# Patient Record
Sex: Female | Born: 1950 | Race: White | Hispanic: No | Smoking: Never smoker
Health system: Southern US, Community
[De-identification: ages and names within clinical notes are randomized; demographics above are authoritative.]

## PROBLEM LIST (undated history)

## (undated) DIAGNOSIS — C719 Malignant neoplasm of brain, unspecified: Secondary | ICD-10-CM

---

## 2021-08-30 DIAGNOSIS — T8132XA Disruption of internal operation (surgical) wound, not elsewhere classified, initial encounter: Secondary | ICD-10-CM

## 2021-08-30 DIAGNOSIS — T81328A Disruption or dehiscence of closure of other specified internal operation (surgical) wound, initial encounter: Secondary | ICD-10-CM

## 2021-08-30 HISTORY — DX: Disruption of internal operation (surgical) wound, not elsewhere classified, initial encounter: T81.32XA

## 2021-08-30 HISTORY — DX: Disruption or dehiscence of closure of other specified internal operation (surgical) wound, initial encounter: T81.328A

## 2021-09-23 ENCOUNTER — Other Ambulatory Visit: Payer: Self-pay | Admitting: Surgery

## 2021-09-23 ENCOUNTER — Other Ambulatory Visit (HOSPITAL_COMMUNITY): Payer: Self-pay | Admitting: Surgery

## 2021-09-23 DIAGNOSIS — C719 Malignant neoplasm of brain, unspecified: Secondary | ICD-10-CM

## 2021-09-25 ENCOUNTER — Other Ambulatory Visit: Payer: Self-pay

## 2021-09-25 ENCOUNTER — Ambulatory Visit
Admission: RE | Admit: 2021-09-25 | Discharge: 2021-09-25 | Disposition: A | Discharge status: Home / Self Care | Payer: Medicare Other | Source: Ambulatory Visit | Attending: Surgery | Admitting: Surgery

## 2021-09-25 DIAGNOSIS — C719 Malignant neoplasm of brain, unspecified: Secondary | ICD-10-CM | POA: Diagnosis present

## 2021-09-25 IMAGING — MR MR HEAD WO/W CM
16 series · 48 of 48 positions shown · IV contrast (gadavist)
Comparison: None.

CLINICAL DATA: History of glioblastoma with craniotomy [DATE]

EXAM:
MRI HEAD WITHOUT AND WITH CONTRAST
TECHNIQUE: Multiplanar, multiecho pulse sequences of the brain and surrounding
structures were obtained without and with intravenous contrast.
CONTRAST:  6mL GADAVIST GADOBUTROL 1 MMOL/ML IV SOLN

[Series 5: ax dwi_tracew · axial · 3.0mm · 0.65mm/px · z∈[-95,+56]mm · 3 of 48 slices shown]
[im 1/48]
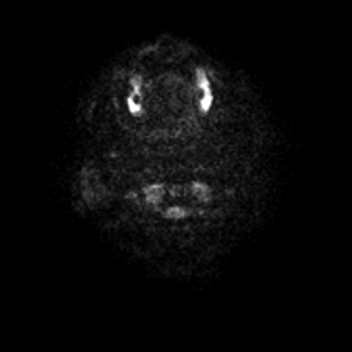
[im 24/48]
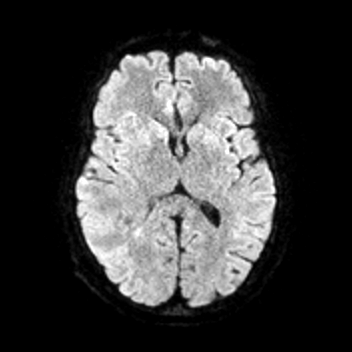
[im 48/48]
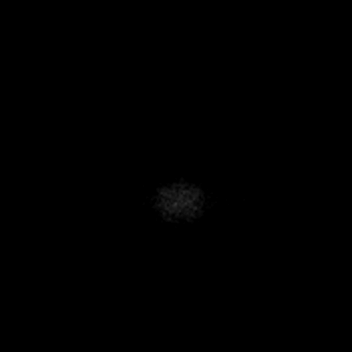

[Series 6: ax dwi_adc · axial · 3.0mm · 0.65mm/px · z∈[-95,+49]mm · 3 of 46 slices shown]
[im 1/46]
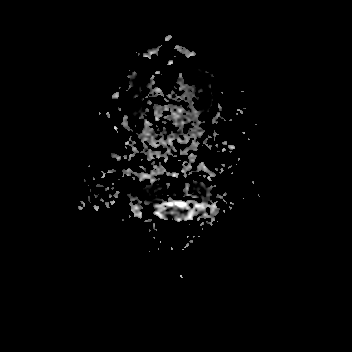
[im 23/46]
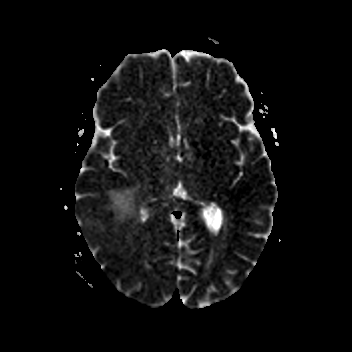
[im 46/46]
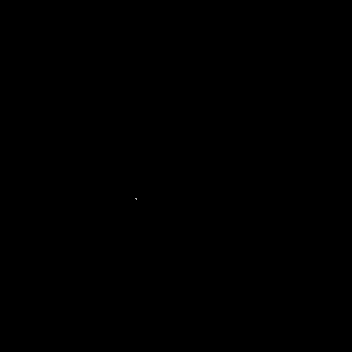

[Series 7: cor dwi_tracew · coronal · 5.0mm · 0.65mm/px · 2 of 40 slices shown]
[im 1/40]
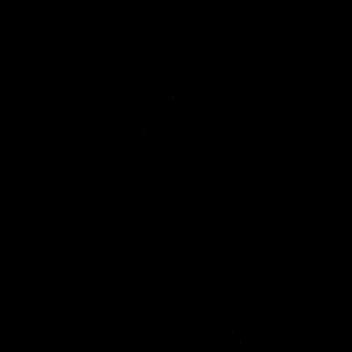
[im 40/40]
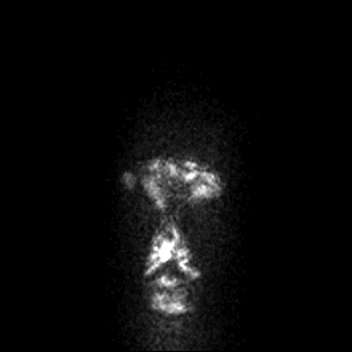

[Series 8: cor dwi_adc · coronal · 5.0mm · 0.65mm/px · 2 of 35 slices shown]
[im 1/35]
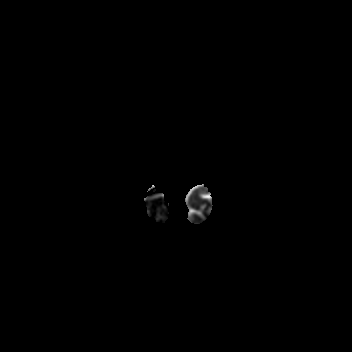
[im 35/35]
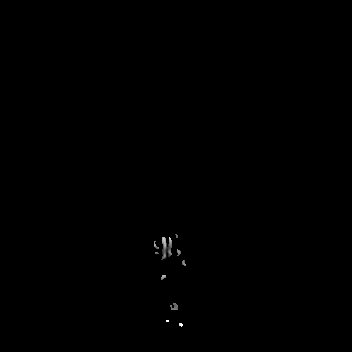

[Series 9: T1 · sagittal · 5.0mm · 0.62mm/px · 1 of 25 slices shown (1 of 2)]
[im 1/25]
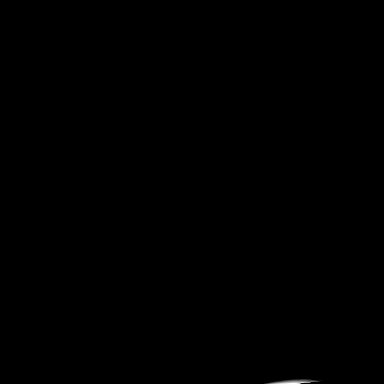

[Series 10: T2 · axial · 5.0mm · 0.53mm/px · 1 of 25 slices shown]
[im 1/25]
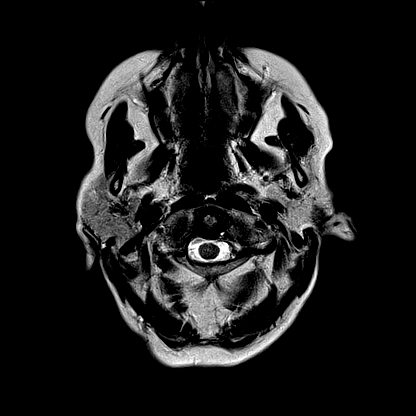

[Series 11: mag_images · axial · 3.0mm · 0.90mm/px · z∈[-105,+68]mm · 3 of 60 slices shown]
[im 1/60]
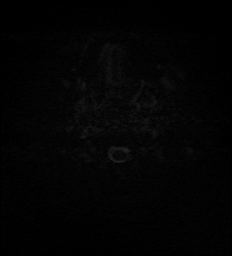
[im 30/60]
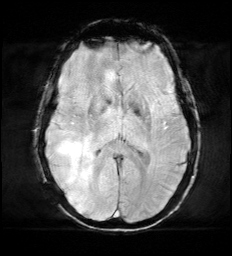
[im 60/60]
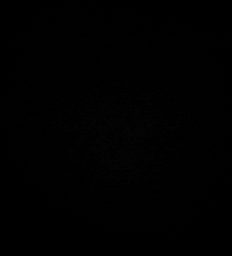

[Series 12: pha_images · axial · 3.0mm · 0.90mm/px · z∈[-102,+68]mm · 3 of 58 slices shown]
[im 1/58]
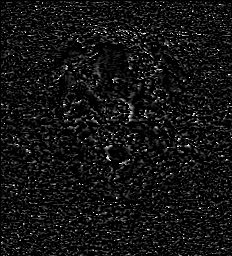
[im 29/58]
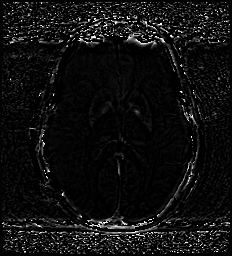
[im 58/58]
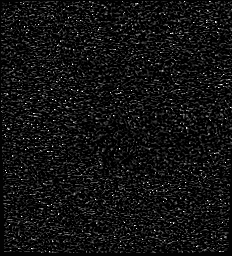

[Series 13: swi_images · axial · 3.0mm · 0.90mm/px · z∈[-105,+68]mm · 3 of 60 slices shown]
[im 1/60]
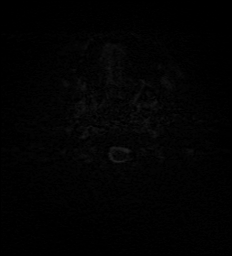
[im 30/60]
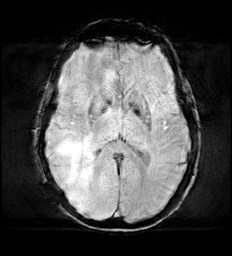
[im 60/60]
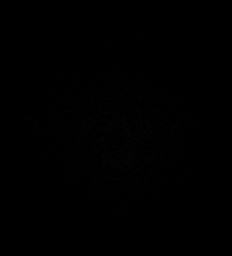

[Series 14: mip_images(sw) · axial · 24.0mm · 0.90mm/px · z∈[-95,+57]mm · 3 of 53 slices shown]
[im 1/53]
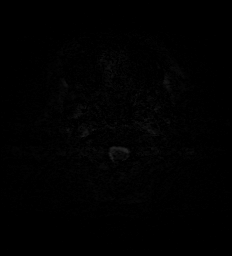
[im 27/53]
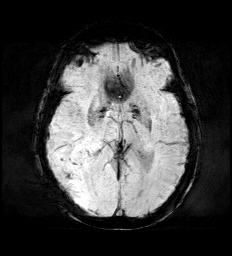
[im 53/53]
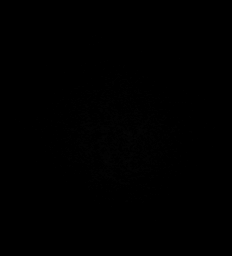

[Series 15: FLAIR · axial · 3.0mm · 0.53mm/px · z∈[-99,+59]mm · 3 of 55 slices shown]
[im 1/55]
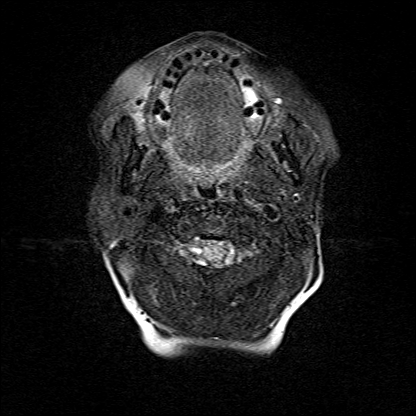
[im 28/55]
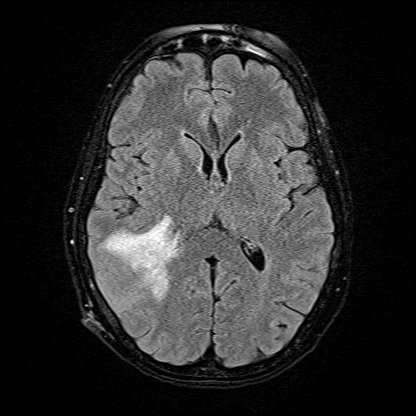
[im 55/55]
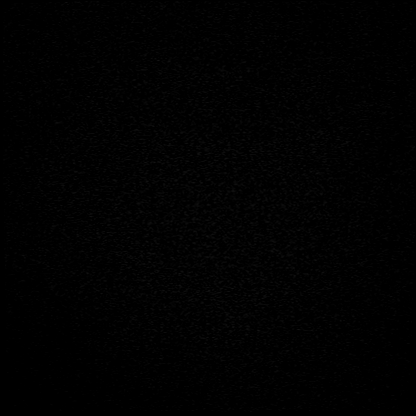

[Series 16: T1 · axial · 1.0mm · 0.98mm/px · z∈[-103,+68]mm · 9 of 176 slices shown (2 of 2)]
[im 1/176]
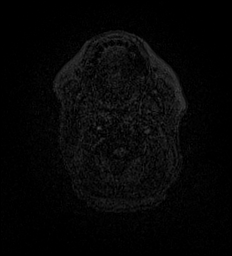
[im 22/176]
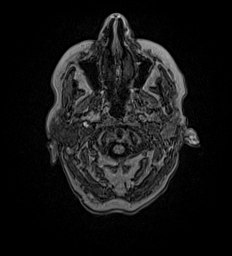
[im 44/176]
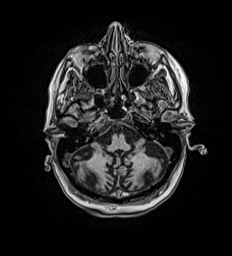
[im 66/176]
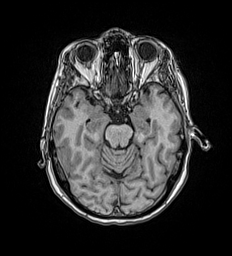
[im 88/176]
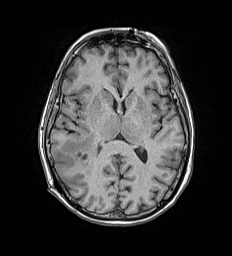
[im 110/176]
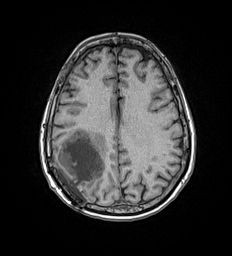
[im 132/176]
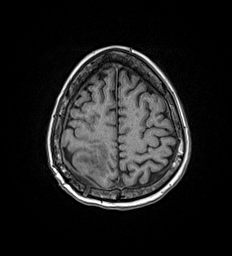
[im 154/176]
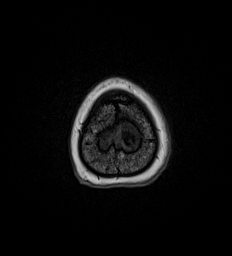
[im 176/176]
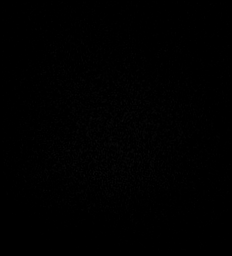

[Series 17: T2 post-contrast · coronal · 5.0mm · 0.57mm/px · 1 of 29 slices shown]
[im 1/29]
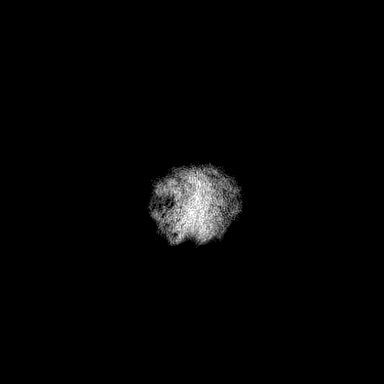

[Series 18: T1 post-contrast · axial · 1.0mm · 0.98mm/px · z∈[-103,+68]mm · 9 of 176 slices shown (1 of 3)]
[im 1/176]
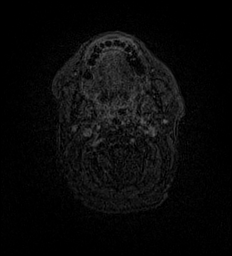
[im 22/176]
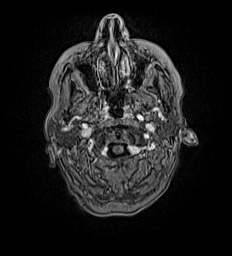
[im 44/176]
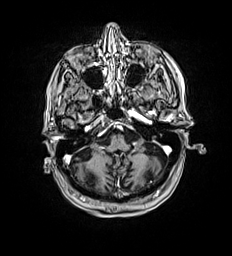
[im 66/176]
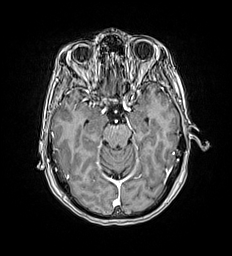
[im 88/176]
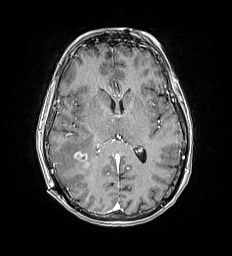
[im 110/176]
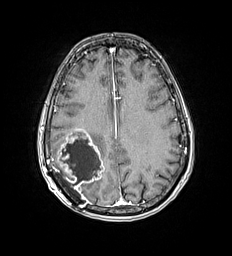
[im 132/176]
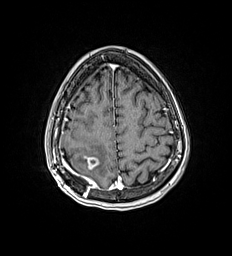
[im 154/176]
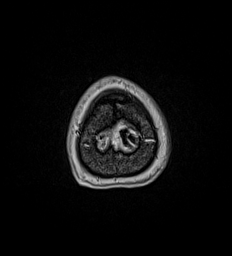
[im 176/176]
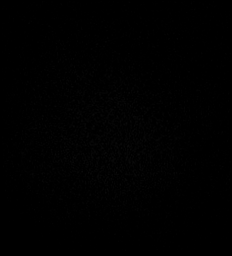

[Series 19: T1 post-contrast · coronal · 5.0mm · 0.57mm/px · 1 of 29 slices shown (2 of 3)]
[im 1/29]
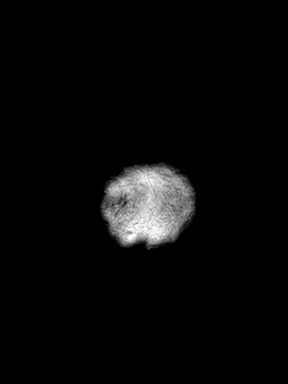

[Series 20: T1 post-contrast · sagittal · 5.0mm · 0.62mm/px · 1 of 24 slices shown (3 of 3)]
[im 1/24]
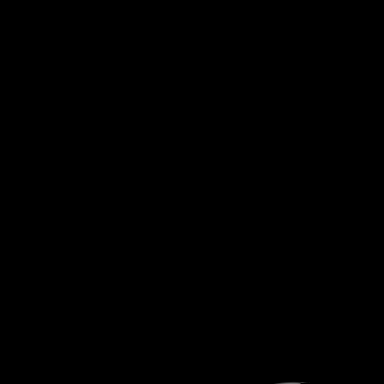

[48 of 48 positions shown; findings below may reference images not displayed]

FINDINGS: Brain: Resection cavity in the right parietal region with variable
thickness peripheral enhancement. Especially at the superior and
lateral margins of the mass there is nodular type enhancement with
signs of dense cellularity by diffusion and T2 weighted imaging.
Local mass effect without midline shift. There is epidural fluid
beneath the bone flap without significant mass effect. No
intraventricular or generalized subarachnoid spread.

No incidental infarct, acute hemorrhage, hydrocephalus, or second
lesion.

Marked cerebellar atrophy which is symmetric and without specific
signal abnormality.

Vascular: Normal flow voids and vascular enhancements.

Skull and upper cervical spine: Unremarkable right parietal
craniotomy

Sinuses/Orbits: Negative
IMPRESSION: 1. Resection cavity centered in the right parietal lobe the
correlates with history of glioblastoma and recent resection.
2. No available preoperative comparison imaging or report. There is
a tumoral appearance at the periphery of the cavity with
enhancement. Mass effect is mild for the size of lesion.
3. Notable cerebellar atrophy.

## 2021-09-25 MED ORDER — GADOBUTROL 1 MMOL/ML IV SOLN
6.0000 mL | Freq: Once | INTRAVENOUS | Status: AC | PRN
  Administered 2021-09-25: 6 mL via INTRAVENOUS

## 2021-10-01 DIAGNOSIS — C719 Malignant neoplasm of brain, unspecified: Secondary | ICD-10-CM | POA: Insufficient documentation

## 2021-10-03 DIAGNOSIS — G40909 Epilepsy, unspecified, not intractable, without status epilepticus: Secondary | ICD-10-CM | POA: Insufficient documentation

## 2021-10-21 ENCOUNTER — Encounter: Payer: Self-pay | Admitting: Neurology

## 2021-10-21 ENCOUNTER — Encounter: Payer: Self-pay | Admitting: *Deleted

## 2021-10-21 ENCOUNTER — Ambulatory Visit
Admission: RE | Admit: 2021-10-21 | Discharge: 2021-10-21 | Disposition: A | Discharge status: Home / Self Care | Payer: Self-pay | Source: Ambulatory Visit | Attending: Radiation Oncology | Admitting: Radiation Oncology

## 2021-10-21 ENCOUNTER — Other Ambulatory Visit: Payer: Self-pay | Admitting: *Deleted

## 2021-10-21 ENCOUNTER — Telehealth: Payer: Self-pay | Admitting: *Deleted

## 2021-10-21 ENCOUNTER — Encounter: Payer: Self-pay | Admitting: Physician Assistant

## 2021-10-21 ENCOUNTER — Encounter: Payer: Self-pay | Admitting: Neurosurgery

## 2021-10-21 DIAGNOSIS — G9389 Other specified disorders of brain: Secondary | ICD-10-CM

## 2021-10-21 NOTE — Telephone Encounter (Signed)
First craniotomy was perfomed at [facility] in [facility] by Dr. Hardin Negus.   We will request medical records and a disc of all images be sent.

## 2021-10-21 NOTE — Telephone Encounter (Signed)
Called patient to give appointment details for Dr. Mickeal Skinner and Dr. Baruch Gouty on Friday 10/25/21 at 9:00.  Patient did not answer, VM left with appointment details and instructions to call back to confirm and so we can ask where she had her first craniotomy performed.

## 2021-10-25 ENCOUNTER — Encounter: Payer: Self-pay | Admitting: Radiation Oncology

## 2021-10-25 ENCOUNTER — Encounter (INDEPENDENT_AMBULATORY_CARE_PROVIDER_SITE_OTHER): Payer: Self-pay

## 2021-10-25 ENCOUNTER — Other Ambulatory Visit: Payer: Self-pay

## 2021-10-25 ENCOUNTER — Inpatient Hospital Stay (HOSPITAL_BASED_OUTPATIENT_CLINIC_OR_DEPARTMENT_OTHER): Payer: Medicare Other | Admitting: Internal Medicine

## 2021-10-25 ENCOUNTER — Other Ambulatory Visit (HOSPITAL_COMMUNITY): Payer: Self-pay

## 2021-10-25 ENCOUNTER — Ambulatory Visit
Admission: RE | Admit: 2021-10-25 | Discharge: 2021-10-25 | Disposition: A | Discharge status: Home / Self Care | Payer: Medicare Other | Source: Ambulatory Visit | Attending: Radiation Oncology | Admitting: Radiation Oncology

## 2021-10-25 ENCOUNTER — Telehealth: Payer: Self-pay | Admitting: Pharmacy Technician

## 2021-10-25 ENCOUNTER — Telehealth: Payer: Self-pay | Admitting: Pharmacist

## 2021-10-25 ENCOUNTER — Encounter: Payer: Self-pay | Admitting: Internal Medicine

## 2021-10-25 VITALS — BP 147/75 | HR 98 | Temp 97.5°F | Resp 16 | Ht 62.0 in | Wt 145.1 lb

## 2021-10-25 DIAGNOSIS — C713 Malignant neoplasm of parietal lobe: Secondary | ICD-10-CM | POA: Insufficient documentation

## 2021-10-25 DIAGNOSIS — C719 Malignant neoplasm of brain, unspecified: Secondary | ICD-10-CM

## 2021-10-25 DIAGNOSIS — G40909 Epilepsy, unspecified, not intractable, without status epilepticus: Secondary | ICD-10-CM | POA: Diagnosis not present

## 2021-10-25 DIAGNOSIS — G4700 Insomnia, unspecified: Secondary | ICD-10-CM | POA: Insufficient documentation

## 2021-10-25 DIAGNOSIS — Z510 Encounter for antineoplastic radiation therapy: Secondary | ICD-10-CM | POA: Insufficient documentation

## 2021-10-25 DIAGNOSIS — R52 Pain, unspecified: Secondary | ICD-10-CM | POA: Diagnosis not present

## 2021-10-25 DIAGNOSIS — D496 Neoplasm of unspecified behavior of brain: Secondary | ICD-10-CM | POA: Insufficient documentation

## 2021-10-25 DIAGNOSIS — Z79899 Other long term (current) drug therapy: Secondary | ICD-10-CM | POA: Insufficient documentation

## 2021-10-25 DIAGNOSIS — Z7952 Long term (current) use of systemic steroids: Secondary | ICD-10-CM | POA: Insufficient documentation

## 2021-10-25 HISTORY — DX: Malignant neoplasm of brain, unspecified: C71.9

## 2021-10-25 MED ORDER — ONDANSETRON HCL 8 MG PO TABS
8.0000 mg | ORAL_TABLET | Freq: Two times a day (BID) | ORAL | 1 refills | Status: DC | PRN
  Filled 2021-10-25 – 2021-11-05 (×2): qty 30, 15d supply, fill #0

## 2021-10-25 MED ORDER — TEMOZOLOMIDE 100 MG PO CAPS
100.0000 mg | ORAL_CAPSULE | Freq: Every day | ORAL | 0 refills | Status: DC
  Filled 2021-10-25: qty 45, 45d supply, fill #0
  Filled 2021-10-29: qty 42, 42d supply, fill #0

## 2021-10-25 MED ORDER — TEMOZOLOMIDE 20 MG PO CAPS
20.0000 mg | ORAL_CAPSULE | Freq: Every day | ORAL | 0 refills | Status: DC
  Filled 2021-10-25 – 2021-10-29 (×2): qty 42, 42d supply, fill #0

## 2021-10-25 NOTE — Telephone Encounter (Signed)
Oral Oncology Pharmacist Encounter ? ?Received new prescription for Temodar (temozolomide) for the treatment of GBM in conjunction with radiation, planned duration until the end of radiation. ? ?CBC from 10/05/21 (Care Everywhere) assessed, no relevant lab abnormalities. Prescription dose and frequency assessed.  ? ?Current medication list in Epic reviewed, no DDIs with temozolomide identified. ? ?Evaluated chart and no patient barriers to medication adherence identified.  ? ?Prescription has been e-scribed to the [facility] for benefits analysis and approval. ? ?Oral Oncology Clinic will continue to follow for insurance authorization, copayment issues, initial counseling and start date. ? ? ?Darl Pikes, PharmD, BCPS, BCOP, CPP ?Hematology/Oncology Clinical Pharmacist Practitioner ?[place]/[facility]/AP Oral Chemotherapy Navigation Clinic ?[phone] ? ?10/25/2021 1:20 PM ? ?

## 2021-10-25 NOTE — Progress Notes (Signed)
[facility] at [facility], [place] [address] [phone]   New Patient Evaluation  Date of Service: 10/25/21 Patient Name: Nina Johnson Patient MRN: [id number] Patient DOB: 04-Sep-1950 Provider: Ventura Sellers, MD  Identifying Statement:  [name] is a 71 y.o. female with right parietal glioblastoma who presents for initial consultation and evaluation.    Referring Provider: No referring provider defined for this encounter.  Oncologic History: Oncology History  Glioblastoma ([place])  10/01/2021 Initial Diagnosis   Glioblastoma ([place])   10/04/2021 Surgery   Re-resection at [facility] with Dr. Tommi Rumps; path c/w glioblastoma IDHwt     Biomarkers:  MGMT Unknown.  IDH 1/2 Wild type.  EGFR Unknown  TERT Unknown   History of Present Illness: The patient's records from the referring physician were obtained and reviewed and the patient interviewed to confirm this HPI.  Nina Johnson presented to medical attention in [facility] in early January this year after first ever seizure.  CNS imaging demonstrated large R parietal mass; she underwent initial resection in [place], where path demonstrated glioblastoma.  She then went to [facility] for additional evaluation, where she subsequently underwent repeat craniotomy with Dr. Tommi Rumps given large amount of residual disease.  Since surgery, she has no specific complaints aside from fatigue.  She remains active, functionally independent, without recurrence of seizures. She would like to obtain radiation and chemotherapy treatments close to home, thus the referral here today.  Medications: No current outpatient medications on file prior to visit.   No current facility-administered medications on file prior to visit.    Allergies: Not on File Past Medical History:  Past Medical History:  Diagnosis Date   Glioblastoma [facility])    Past Surgical History:  Social History:  Social  History   Socioeconomic History   Marital status: Widowed    Spouse name: Not on file   Number of children: Not on file   Years of education: Not on file   Highest education level: Not on file  Occupational History   Not on file  Tobacco Use   Smoking status: Not on file   Smokeless tobacco: Not on file  Substance and Sexual Activity   Alcohol use: Not on file   Drug use: Not on file   Sexual activity: Not on file  Other Topics Concern   Not on file  Social History Narrative   Not on file   Social Determinants of Health   Financial Resource Strain: Not on file  Food Insecurity: Not on file  Transportation Needs: Not on file  Physical Activity: Not on file  Stress: Not on file  Social Connections: Not on file  Intimate Partner Violence: Not on file   Family History: No family history on file.  Review of Systems: Constitutional: Doesn't report fevers, chills or abnormal weight loss Eyes: Doesn't report blurriness of vision Ears, nose, mouth, throat, and face: Doesn't report sore throat Respiratory: Doesn't report cough, dyspnea or wheezes Cardiovascular: Doesn't report palpitation, chest discomfort  Gastrointestinal:  Doesn't report nausea, constipation, diarrhea GU: Doesn't report incontinence Skin: Doesn't report skin rashes Neurological: Per HPI Musculoskeletal: Doesn't report joint pain Behavioral/Psych: Doesn't report anxiety  Physical Exam: Wt Readings from Last 3 Encounters:  10/25/21 145 lb 1.6 oz (65.8 kg)   Temp Readings from Last 3 Encounters:  10/25/21 (!) 97.5 F (36.4 C) (Tympanic)   BP Readings from Last 3 Encounters:  10/25/21 (!) 147/75   Pulse  Readings from Last 3 Encounters:  10/25/21 98    KPS: 90. General: Alert, cooperative, pleasant, in no acute distress Head: Normal EENT: No conjunctival injection or scleral icterus.  Lungs: Resp effort normal Cardiac: Regular rate Abdomen: Non-distended abdomen Skin: No rashes cyanosis or  petechiae. Extremities: No clubbing or edema  Neurologic Exam: Mental Status: Awake, alert, attentive to examiner. Oriented to self and environment. Language is fluent with intact comprehension.  Cranial Nerves: Visual acuity is grossly normal. Visual fields are full. Extra-ocular movements intact. No ptosis. Face is symmetric Motor: Tone and bulk are normal. Power is full in both arms and legs. Reflexes are symmetric, no pathologic reflexes present.  Sensory: Intact to light touch Gait: Normal.   Labs: I have reviewed the data as listed No results found for: NA, K, CL, CO2, GLUCOSE, BUN, CREATININE, CALCIUM, PROT, ALBUMIN, AST, ALT, ALKPHOS, BILITOT, GFRNONAA, GFRAA No results found for: WBC, NEUTROABS, HGB, HCT, MCV, PLT  Imaging:  MR BRAIN W WO CONTRAST  Result Date: 09/26/2021 CLINICAL DATA:  History of glioblastoma with craniotomy 08/30/2021 EXAM: MRI HEAD WITHOUT AND WITH CONTRAST TECHNIQUE: Multiplanar, multiecho pulse sequences of the brain and surrounding structures were obtained without and with intravenous contrast. CONTRAST:  36mL GADAVIST GADOBUTROL 1 MMOL/ML IV SOLN COMPARISON:  None. FINDINGS: Brain: Resection cavity in the right parietal region with variable thickness peripheral enhancement. Especially at the superior and lateral margins of the mass there is nodular type enhancement with signs of dense cellularity by diffusion and T2 weighted imaging. Local mass effect without midline shift. There is epidural fluid beneath the bone flap without significant mass effect. No intraventricular or generalized subarachnoid spread. No incidental infarct, acute hemorrhage, hydrocephalus, or second lesion. Marked cerebellar atrophy which is symmetric and without specific signal abnormality. Vascular: Normal flow voids and vascular enhancements. Skull and upper cervical spine: Unremarkable right parietal craniotomy Sinuses/Orbits: Negative IMPRESSION: 1. Resection cavity centered in the right  parietal lobe the correlates with history of glioblastoma and recent resection. 2. No available preoperative comparison imaging or report. There is a tumoral appearance at the periphery of the cavity with enhancement. Mass effect is mild for the size of lesion. 3. Notable cerebellar atrophy. Electronically Signed   By: Jorje Guild M.D.   On: 09/26/2021 08:18    Pathology (10/04/21): A-B. Brain, right parietal lesion, resection:   Glioblastoma, IDH-wildtype, CNS WHO Grade 4, residual.    Comment: IDH status was determined by immunostaining for the IDH1 R132H mutation; in patients 59 and older, this immunophenotype almost always indicates IDH-wildtype status.  Assessment/Plan Glioblastoma ([place])  Penni Penado presents today with clinical and radiographic syndrome consistent with right parietal glioblastoma, IDHwt.  Dr. Hali Marry at [facility] will act as her primary neuro-oncologist, we will implement treatment plan here in [facility] per patient preference.    We had an extensive conversation with her regarding pathology, prognosis, and available treatment pathways.    We ultimately recommended proceeding with course of intensity modulated radiation therapy and concurrent daily Temozolomide.  Radiation will be administered Mon-Fri over 6 weeks, Temodar will be dosed at $Remove'75mg'zpPdvHV$ /m2 to be given daily over 42 days.  We reviewed side effects of temodar, including fatigue, nausea/vomiting, constipation, and cytopenias.  Informed consent was verbally obtained at bedside to proceed with oral chemotherapy.  Chemotherapy should be held for the following:  ANC less than 1,000  Platelets less than 100,000  LFT or creatinine greater than 2x ULN  If clinical concerns/contraindications develop  Every 2 weeks during radiation,  labs will be checked accompanied by a clinical evaluation in the brain tumor clinic.  She will con't Keppra $RemoveBefo'500mg'euaBagWyUMo$  BID, remain off dexamethasone.  We appreciate the opportunity to  participate in the care of Aloni Chuang.   Screening for potential clinical trials was performed and discussed using eligibility criteria for active protocols at [facility], loco-regional tertiary centers, as well as national database available on [email or web address].com.    The patient is not a candidate for a research protocol at this time due to no suitable study identified.   We spent twenty additional minutes teaching regarding the natural history, biology, and historical experience in the treatment of brain tumors. We then discussed in detail the current recommendations for therapy focusing on the mode of administration, mechanism of action, anticipated toxicities, and quality of life issues associated with this plan. We also provided teaching sheets for the patient to take home as an additional resource.  All questions were answered. The patient knows to call the clinic with any problems, questions or concerns. No barriers to learning were detected.  The total time spent in the encounter was 60 minutes and more than 50% was on counseling and review of test results   Ventura Sellers, MD Medical Director of Neuro-Oncology [facility] [place] 10/25/21 9:15 AM

## 2021-10-25 NOTE — Progress Notes (Signed)
START ON PATHWAY REGIMEN - Neuro ? ? ?  One cycle, concurrent with RT: ?    Temozolomide  ? ?**Always confirm dose/schedule in your pharmacy ordering system** ? ?Patient Characteristics: ?Glioma, Glioblastoma, IDH-wildtype, Newly Diagnosed / Treatment Naive, Good Performance Status and/or Younger Patient, MGMT Promoter Unmethylated/Unknown ?Disease Classification: Glioma ?Disease Classification: Glioblastoma, IDH-wildtype ?Disease Status: Newly Diagnosed / Treatment Naive ?Performance Status: Good Performance Status and/or Younger Patient ?MGMT Promoter Methylation Status: Awaiting Test Results ?Intent of Therapy: ?Non-Curative / Palliative Intent, Discussed with Patient ?

## 2021-10-25 NOTE — Consult Note (Signed)
?NEW PATIENT EVALUATION ? ?Name: Nina Johnson  ?MRN: [id number]  ?Date:   10/25/2021     ?DOB: April 26, 1951 ? ? ?This 71 y.o. female patient presents to the clinic for initial evaluation of right parietal GBM status post resection x2. ? ?REFERRING PHYSICIAN: No ref. provider found ? ?CHIEF COMPLAINT:  ?Chief Complaint  ?Patient presents with  ? Cancer  ?  Initial consultation  ? ? ?DIAGNOSIS: The encounter diagnosis was Glioblastoma ([place]). ?  ?PREVIOUS INVESTIGATIONS:  ?MRI scans reviewed ?Pathology report reviewed ?Clinical notes reviewed ? ?HPI: Patient is a 71 year old female who presented with first onset of seizure.  She was seen in [facility] MRI scan showed right parietal mass.  She underwent a partial resection for GBM.  She was also seen at [facility] who underwent reexcision.  MRI postop showed expected postsurgical changes with persistent linear contrast-enhancement at the periphery which may represent post evolving surgical changes or residual tumor.  Surgical pathology confirmed glioblastoma IDH wild-type CNS who grade 4.  She is tolerated both surgeries well.  She has very little neurologic compromise at this point.  She is having no headaches no change in visual fields or any focal neurologic deficits.  She is now referred to both radiation oncology and medical oncology for consideration of adjuvant treatment. ? ?PLANNED TREATMENT REGIMEN: IMRT partial brain radiation plus Temodar ? ?PAST MEDICAL HISTORY:  has a past medical history of Dehiscence of closure of skull or craniotomy (10/04/2021), Dehiscence of closure of skull or craniotomy (08/30/2021), and Glioblastoma ([place]).   ? ?PAST SURGICAL HISTORY ? ?FAMILY HISTORY: family history includes Cancer in an other family member. ? ?SOCIAL HISTORY:  reports that she has never smoked. She has never used smokeless tobacco. She reports that she does not drink alcohol and does not use drugs. ? ?ALLERGIES: Patient has no known allergies. ? ?MEDICATIONS:  ?Current  Outpatient Medications  ?Medication Sig Dispense Refill  ? acetaminophen (TYLENOL) 650 MG CR tablet Take by mouth.    ? DOCUSATE SODIUM PO Take by mouth every other day.    ? hydrocortisone (CORTEF) 10 MG tablet Hydrocortisone taper starting on 10/14/2021 (after finish taking Dexamethasone) ?10/14/2021 - 10/18/2021: 2 tablets (20 mg) in the AM and 1 tablet (10 mg) in the afternoon x 5 days,  ?10/19/2021 - 10/23/2021: 1 tablet (10 mg) in the AM and 0.5 tablet (5 mg) in the afternoon x 5 days;  ?10/24/2021 - 10/28/2021: 0.5 tablet (5 mg) in the AM x 5 days ?10/29/2021: STOP.    ? levETIRAcetam (KEPPRA) 500 MG tablet SMARTSIG:1 Tablet(s) By Mouth Every 12 Hours    ? ondansetron (ZOFRAN-ODT) 4 MG disintegrating tablet Take by mouth.    ? oxyCODONE (OXY IR/ROXICODONE) 5 MG immediate release tablet Take by mouth.    ? pantoprazole (PROTONIX) 40 MG tablet Take by mouth.    ? ?No current facility-administered medications for this encounter.  ? ? ?ECOG PERFORMANCE STATUS:  0 - Asymptomatic ? ?REVIEW OF SYSTEMS: ?Patient denies any weight loss, fatigue, weakness, fever, chills or night sweats. Patient denies any loss of vision, blurred vision. Patient denies any ringing  of the ears or hearing loss. No irregular heartbeat. Patient denies heart murmur or history of fainting. Patient denies any chest pain or pain radiating to her upper extremities. Patient denies any shortness of breath, difficulty breathing at night, cough or hemoptysis. Patient denies any swelling in the lower legs. Patient denies any nausea vomiting, vomiting of blood, or coffee ground material in the vomitus.  Patient denies any stomach pain. Patient states has had normal bowel movements no significant constipation or diarrhea. Patient denies any dysuria, hematuria or significant nocturia. Patient denies any problems walking, swelling in the joints or loss of balance. Patient denies any skin changes, loss of hair or loss of weight. Patient denies any excessive worrying  or anxiety or significant depression. Patient denies any problems with insomnia. Patient denies excessive thirst, polyuria, polydipsia. Patient denies any swollen glands, patient denies easy bruising or easy bleeding. Patient denies any recent infections, allergies or URI. Patient "s visual fields have not changed significantly in recent time. ?  ?PHYSICAL EXAM: ?BP (!) 147/75 (BP Location: Right Arm, Patient Position: Sitting)   Pulse 98   Temp (!) 97.5 ?F (36.4 ?C) (Tympanic)   Resp 16   Ht $R'5\' 2"'lF$  (1.575 m)   Wt 145 lb 1.6 oz (65.8 kg)   BMI 26.54 kg/m?  ?Crude visual fields within normal range motor or sensory in detail levels are equal and symmetric in upper lower extremities proprioception is intact.  Her incision is healing well.  Well-developed well-nourished patient in NAD. HEENT reveals PERLA, EOMI, discs not visualized.  Oral cavity is clear. No oral mucosal lesions are identified. Neck is clear without evidence of cervical or supraclavicular adenopathy. Lungs are clear to A&P. Cardiac examination is essentially unremarkable with regular rate and rhythm without murmur rub or thrill. Abdomen is benign with no organomegaly or masses noted. Motor sensory and DTR levels are equal and symmetric in the upper and lower extremities. Cranial nerves II through XII are grossly intact. Proprioception is intact. No peripheral adenopathy or edema is identified. No motor or sensory levels are noted. Crude visual fields are within normal range. ? ?LABORATORY DATA: Pathology reports reviewed ? ?  ?RADIOLOGY RESULTS: MRI scans reviewed compatible with above-stated findings ? ? ?IMPRESSION: Wild-type GBM of the right parietal lobe in 71 year old female status post resection x2 for adjuvant treatment ? ?PLAN: At this time elect to go ahead with partial brain radiation.  Would plan on delivering 60 Gray in 30 fractions using IMRT treatment planning and delivery.  Risks and benefits of treatment including hair loss  possible brain swelling small potential for brain necrosis all were discussed in detail with the patient.  I have personally set up and ordered CT simulation for early next week.  She will be seeing Dr. Mickeal Skinner today for medical oncology opinion and I presume will be placed on Temodar.  Patient comprehends my recommendations well. ? ?I would like to take this opportunity to thank you for allowing me to participate in the care of your patient.. ? ?Noreene Filbert, MD ? ? ? ? ? ? ? ? ?

## 2021-10-25 NOTE — Telephone Encounter (Signed)
Oral Oncology Patient Advocate Encounter ? ?After completing a benefits investigation, prior authorization for Temodar is not required at this time through Medicare A/B. ? ?Test claim from [facility] revealed copays as follows: ?100mg  #42 capsules $35.45 ?20mg  #42 capsules $7.09 ? ?Dennison Nancy CPHT ?Specialty Pharmacy Patient Advocate ?[place] ?Phone [phone] ?Fax [phone] ?10/25/2021 2:46 PM ?   ? ? ?  ? ?

## 2021-10-28 ENCOUNTER — Ambulatory Visit: Admission: RE | Admit: 2021-10-28 | Payer: Medicare Other | Source: Ambulatory Visit

## 2021-10-28 DIAGNOSIS — Z510 Encounter for antineoplastic radiation therapy: Secondary | ICD-10-CM | POA: Diagnosis not present

## 2021-10-29 ENCOUNTER — Other Ambulatory Visit (HOSPITAL_COMMUNITY): Payer: Self-pay

## 2021-10-31 ENCOUNTER — Ambulatory Visit
Admission: RE | Admit: 2021-10-31 | Discharge: 2021-10-31 | Disposition: A | Discharge status: Home / Self Care | Payer: Self-pay | Source: Ambulatory Visit | Attending: Radiation Oncology | Admitting: Radiation Oncology

## 2021-10-31 ENCOUNTER — Other Ambulatory Visit: Payer: Self-pay | Admitting: *Deleted

## 2021-10-31 DIAGNOSIS — C719 Malignant neoplasm of brain, unspecified: Secondary | ICD-10-CM

## 2021-10-31 DIAGNOSIS — Z510 Encounter for antineoplastic radiation therapy: Secondary | ICD-10-CM | POA: Diagnosis not present

## 2021-11-03 ENCOUNTER — Emergency Department: Payer: Medicare Other

## 2021-11-03 ENCOUNTER — Observation Stay
Admission: EM | Admit: 2021-11-03 | Discharge: 2021-11-04 | Disposition: A | Discharge status: Other Acute Inpatient Hospital | Payer: Medicare Other | Attending: Internal Medicine | Admitting: Internal Medicine

## 2021-11-03 ENCOUNTER — Other Ambulatory Visit: Payer: Self-pay

## 2021-11-03 ENCOUNTER — Encounter: Payer: Self-pay | Admitting: Internal Medicine

## 2021-11-03 DIAGNOSIS — Z79899 Other long term (current) drug therapy: Secondary | ICD-10-CM | POA: Insufficient documentation

## 2021-11-03 DIAGNOSIS — M1990 Unspecified osteoarthritis, unspecified site: Secondary | ICD-10-CM | POA: Diagnosis present

## 2021-11-03 DIAGNOSIS — C719 Malignant neoplasm of brain, unspecified: Secondary | ICD-10-CM | POA: Diagnosis not present

## 2021-11-03 DIAGNOSIS — Z23 Encounter for immunization: Secondary | ICD-10-CM | POA: Diagnosis not present

## 2021-11-03 DIAGNOSIS — R569 Unspecified convulsions: Secondary | ICD-10-CM | POA: Diagnosis present

## 2021-11-03 DIAGNOSIS — G40909 Epilepsy, unspecified, not intractable, without status epilepticus: Secondary | ICD-10-CM

## 2021-11-03 DIAGNOSIS — Z20822 Contact with and (suspected) exposure to covid-19: Secondary | ICD-10-CM | POA: Insufficient documentation

## 2021-11-03 DIAGNOSIS — G40109 Localization-related (focal) (partial) symptomatic epilepsy and epileptic syndromes with simple partial seizures, not intractable, without status epilepticus: Secondary | ICD-10-CM | POA: Diagnosis not present

## 2021-11-03 DIAGNOSIS — K219 Gastro-esophageal reflux disease without esophagitis: Secondary | ICD-10-CM

## 2021-11-03 DIAGNOSIS — K148 Other diseases of tongue: Secondary | ICD-10-CM | POA: Diagnosis present

## 2021-11-03 LAB — URINALYSIS, ROUTINE W REFLEX MICROSCOPIC
Bilirubin Urine: NEGATIVE
Glucose, UA: NEGATIVE mg/dL
Hgb urine dipstick: NEGATIVE
Ketones, ur: NEGATIVE mg/dL
Leukocytes,Ua: NEGATIVE
Nitrite: NEGATIVE
Protein, ur: NEGATIVE mg/dL
Specific Gravity, Urine: 1.009 (ref 1.005–1.030)
pH: 8 (ref 5.0–8.0)

## 2021-11-03 LAB — RESP PANEL BY RT-PCR (FLU A&B, COVID) ARPGX2
Influenza A by PCR: NEGATIVE
Influenza B by PCR: NEGATIVE
SARS Coronavirus 2 by RT PCR: NEGATIVE

## 2021-11-03 LAB — CBC WITH DIFFERENTIAL/PLATELET
Abs Immature Granulocytes: 0.05 10*3/uL (ref 0.00–0.07)
Basophils Absolute: 0 10*3/uL (ref 0.0–0.1)
Basophils Relative: 1 %
Eosinophils Absolute: 0.1 10*3/uL (ref 0.0–0.5)
Eosinophils Relative: 1 %
HCT: 28.2 % — ABNORMAL LOW (ref 36.0–46.0)
Hemoglobin: 7.8 g/dL — ABNORMAL LOW (ref 12.0–15.0)
Immature Granulocytes: 1 %
Lymphocytes Relative: 38 %
Lymphs Abs: 2.3 10*3/uL (ref 0.7–4.0)
MCH: 20.7 pg — ABNORMAL LOW (ref 26.0–34.0)
MCHC: 27.7 g/dL — ABNORMAL LOW (ref 30.0–36.0)
MCV: 75 fL — ABNORMAL LOW (ref 80.0–100.0)
Monocytes Absolute: 0.6 10*3/uL (ref 0.1–1.0)
Monocytes Relative: 10 %
Neutro Abs: 2.9 10*3/uL (ref 1.7–7.7)
Neutrophils Relative %: 49 %
Platelets: 469 10*3/uL — ABNORMAL HIGH (ref 150–400)
RBC: 3.76 MIL/uL — ABNORMAL LOW (ref 3.87–5.11)
RDW: 17.4 % — ABNORMAL HIGH (ref 11.5–15.5)
WBC: 6 10*3/uL (ref 4.0–10.5)
nRBC: 0 % (ref 0.0–0.2)

## 2021-11-03 LAB — COMPREHENSIVE METABOLIC PANEL
ALT: 32 U/L (ref 0–44)
AST: 38 U/L (ref 15–41)
Albumin: 3.5 g/dL (ref 3.5–5.0)
Alkaline Phosphatase: 71 U/L (ref 38–126)
Anion gap: 7 (ref 5–15)
BUN: 12 mg/dL (ref 8–23)
CO2: 25 mmol/L (ref 22–32)
Calcium: 8.9 mg/dL (ref 8.9–10.3)
Chloride: 107 mmol/L (ref 98–111)
Creatinine, Ser: 0.73 mg/dL (ref 0.44–1.00)
GFR, Estimated: >60 mL/min (ref >60)
Glucose, Bld: 101 mg/dL — ABNORMAL HIGH (ref 70–99)
Potassium: 3.4 mmol/L — ABNORMAL LOW (ref 3.5–5.1)
Sodium: 139 mmol/L (ref 135–145)
Total Bilirubin: 0.5 mg/dL (ref 0.3–1.2)
Total Protein: 6.5 g/dL (ref 6.5–8.1)

## 2021-11-03 LAB — MAGNESIUM: Magnesium: 2.3 mg/dL (ref 1.7–2.4)

## 2021-11-03 IMAGING — CT CT HEAD W/O CM
4 series · 15 of 47 positions shown, 17 images · non-contrast
Comparison: [DATE] head MRI from [facility]. [DATE] head CT from
[facility] [HOSPITAL].

CLINICAL DATA: Head trauma.  Seizure.  History of glioblastoma.



[Series 2: head wo · axial · 0.41mm/px · z∈[-79,+41]mm · 7 of 33 slices shown, 9 images]
[im 5/33  brain]
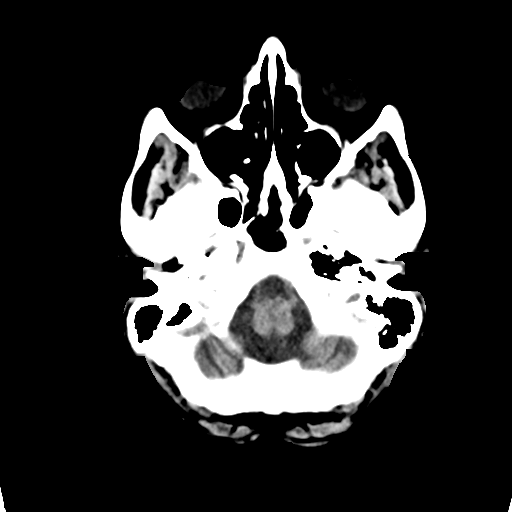
[im 5/33  bone]
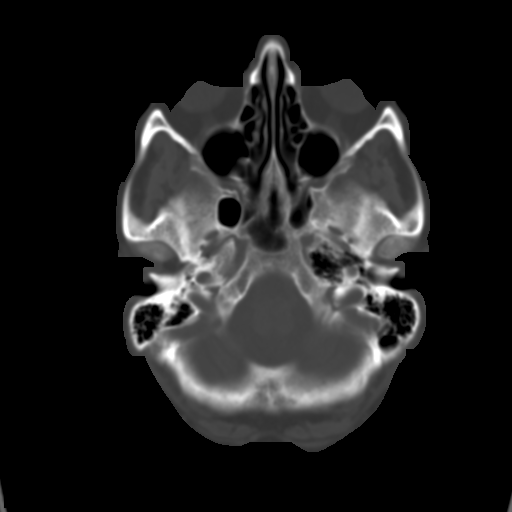
[im 9/33  brain]
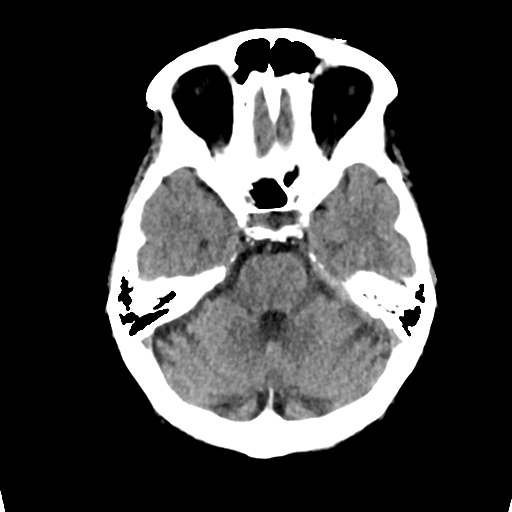
[im 13/33  brain]
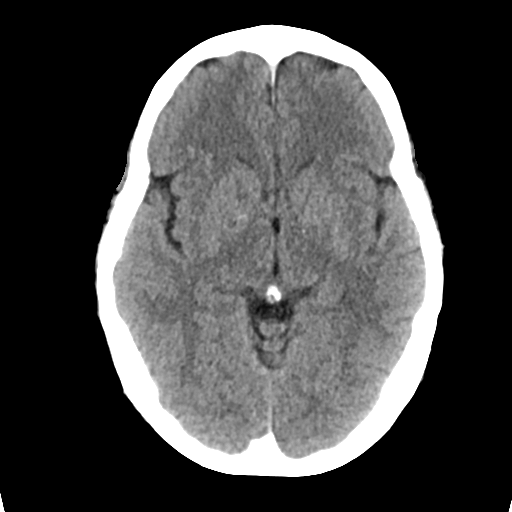
[im 17/33  brain]
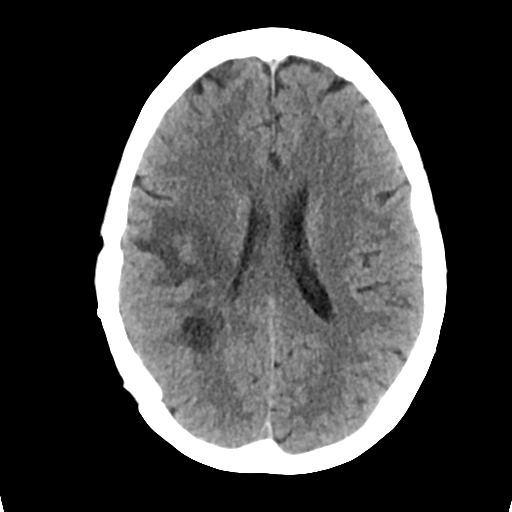
[im 21/33  brain]
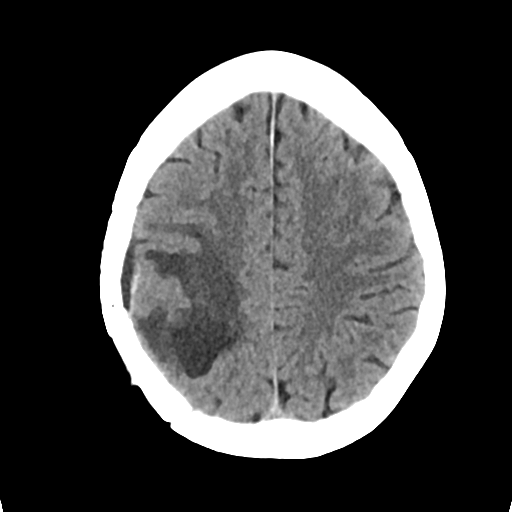
[im 21/33  bone]
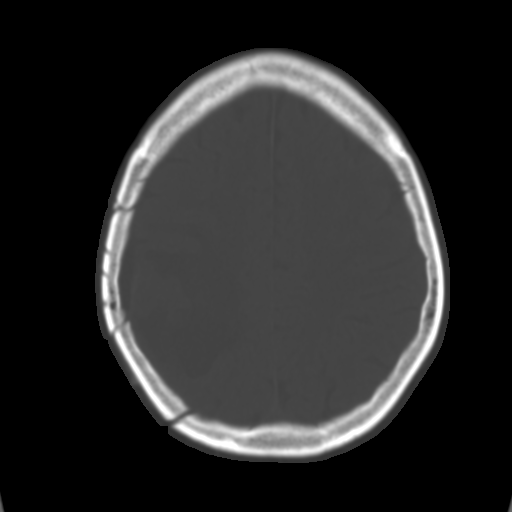
[im 25/33  brain]
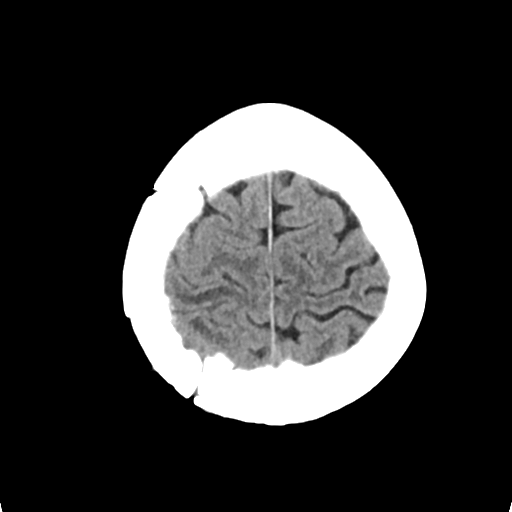
[im 29/33  brain]
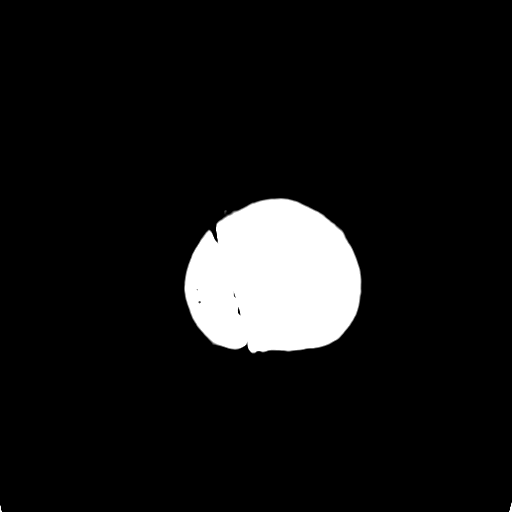

[Series 3: head bone · axial · 0.41mm/px · z∈[-83,-67]mm · 2 of 81 slices shown]
[im 9/81  bone]
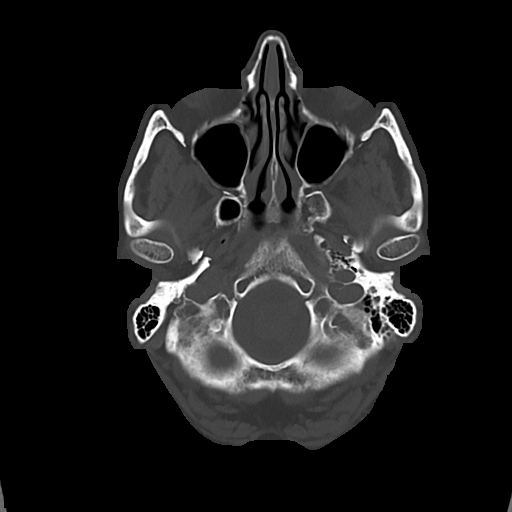
[im 17/81  bone]
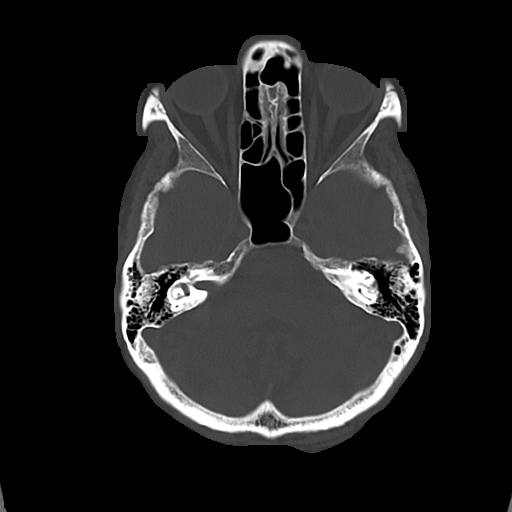

[Series 4: cor soft · coronal · 0.38mm/px · 3 of 66 slices shown]
[im 22/66  brain]
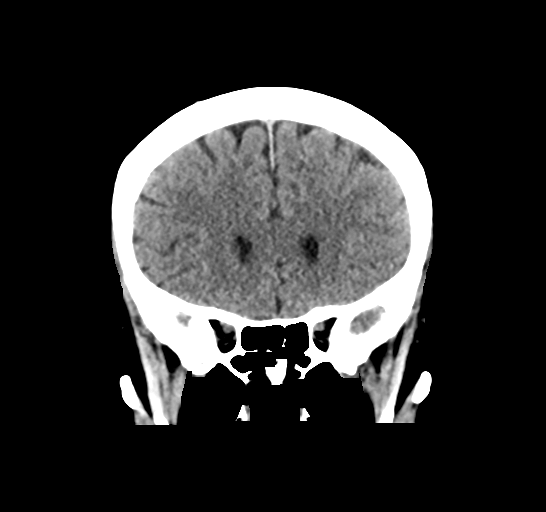
[im 29/66  brain]
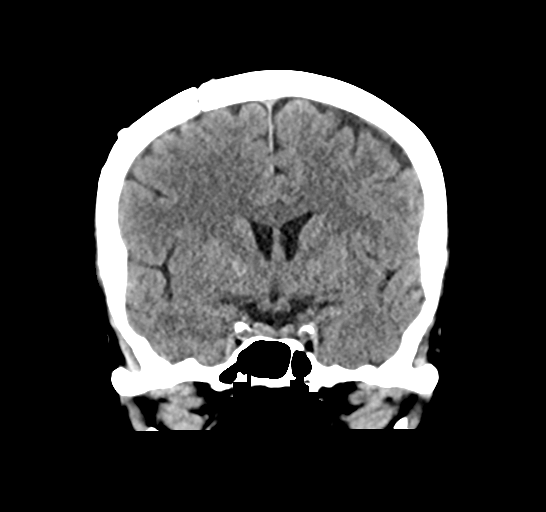
[im 37/66  brain]
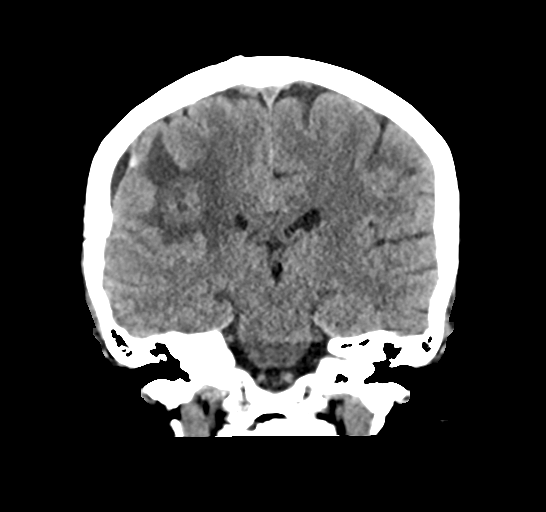

[Series 5: sag soft · sagittal · 0.34mm/px · 3 of 62 slices shown]
[im 21/62  brain]
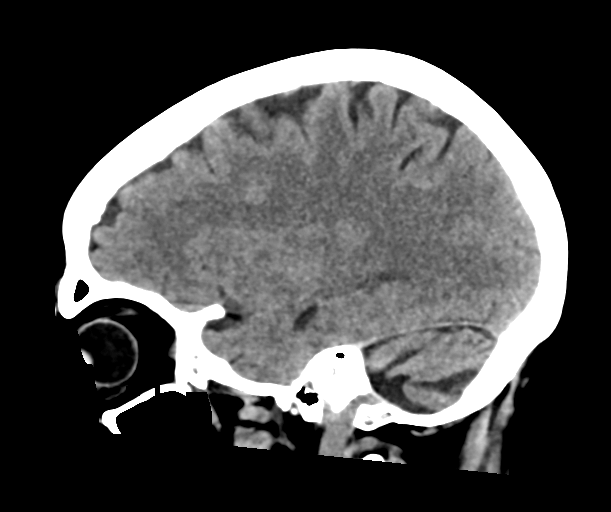
[im 31/62  brain]
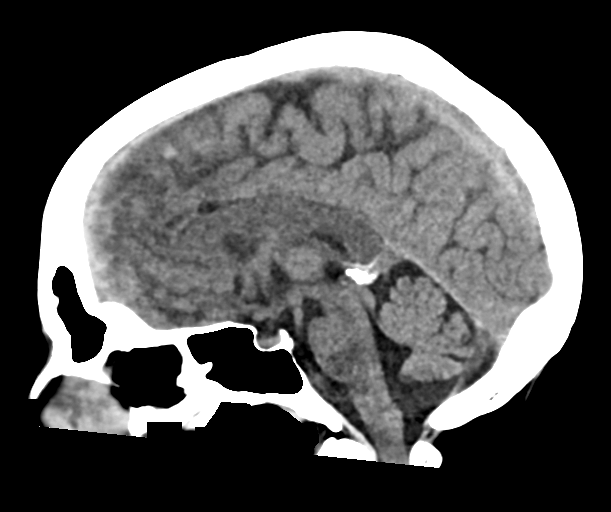
[im 41/62  brain]
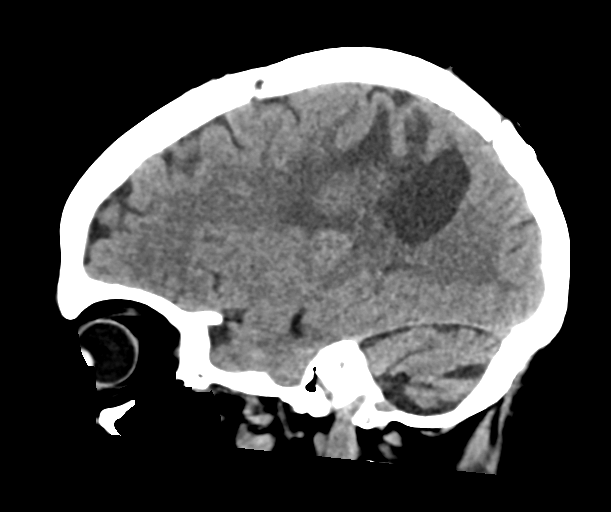

[15 of 47 positions shown; findings below may reference images not displayed]

FINDINGS: CT HEAD FINDINGS

Brain: Sequelae of right parietal tumor resection are again
identified. A cystic area in the right parietal lobe appears larger
than on the prior MRI and may reflect a resection cavity or
cystic/necrotic tumor. Additional low-density in the surrounding
white matter suggestive of edema has mildly increased. There is no
midline shift or other significant mass effect. Trace extra-axial
fluid is noted subjacent to the craniotomy. No acute cortically
based infarct or acute intracranial hemorrhage is identified. The
lateral and third ventricles are normal in size. There is ex vacuo
dilatation of the fourth ventricle related to prominent inferior
cerebellar atrophy.

Vascular: Calcified atherosclerosis at the skull base. No hyperdense
vessel.

Skull: Right parietal craniotomy.

Sinuses/Orbits: Visualized paranasal sinuses and mastoid air cells
are clear. Unremarkable orbits.

Other: None.

CT CERVICAL SPINE FINDINGS

Alignment: Straightening of the normal cervical lordosis. Trace
anterolisthesis of C6 on C7. Left convex curvature of the cervical
spine.

Skull base and vertebrae: No acute fracture or suspicious osseous
lesion.

Soft tissues and spinal canal: No prevertebral fluid or swelling. No
visible canal hematoma.

Disc levels: Focally advanced disc degeneration at C5-6 where there
is severe disc space narrowing and uncovertebral spurring with mild
bilateral neural foraminal stenosis.

Upper chest: Nonspecific mosaic attenuation in the lung apices.

Other: Subcentimeter bilateral thyroid nodules for which no
follow-up imaging is recommended.
IMPRESSION: 1. Right parietal glioblastoma with mildly increased regional low
density which may reflect edema and/or tumor. No significant mass
effect.
2. No evidence of an acute infarct or intracranial hemorrhage.
3. No acute cervical spine fracture.

## 2021-11-03 IMAGING — CT CT CERVICAL SPINE W/O CM
3 of 4 series · 13 of 33 positions shown, 16 images · non-contrast
Comparison: [DATE] head MRI from [facility]. [DATE] head CT from
[facility] [HOSPITAL].

CLINICAL DATA: Head trauma.  Seizure.  History of glioblastoma.



[Series 5: sag bone · sagittal · 0.45mm/px · 5 of 121 slices shown, 6 images]
[im 41/121  bone]
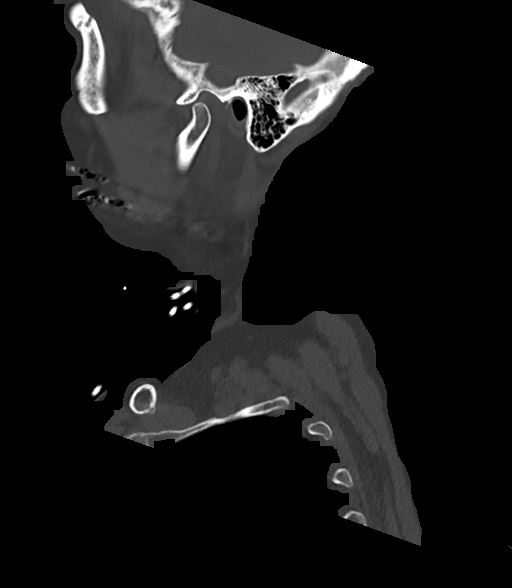
[im 51/121  bone]
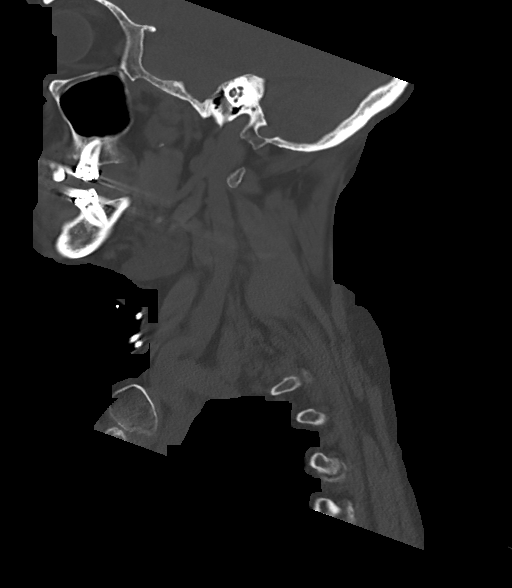
[im 61/121  soft-tissue]
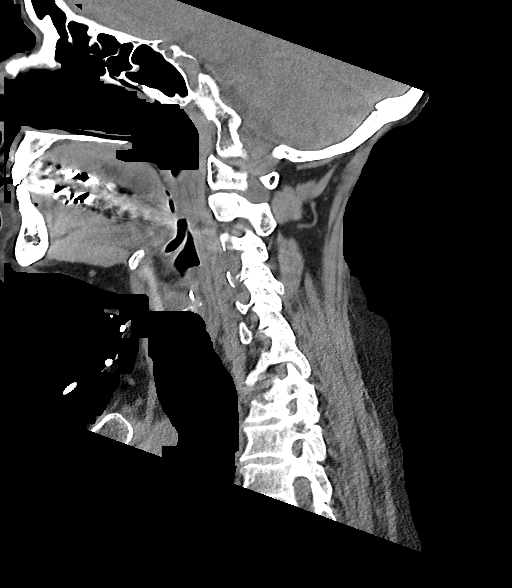
[im 61/121  bone]
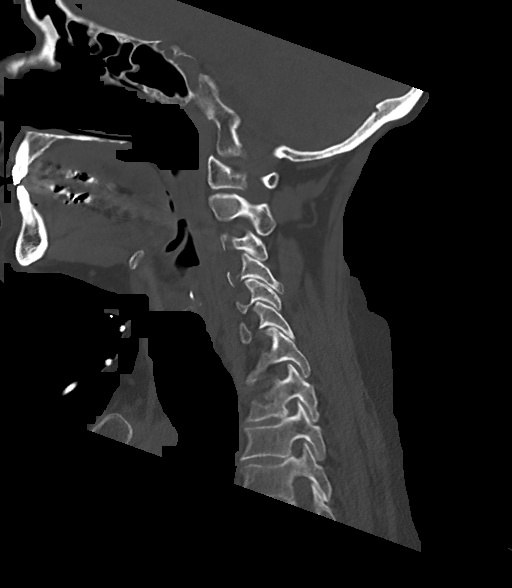
[im 71/121  bone]
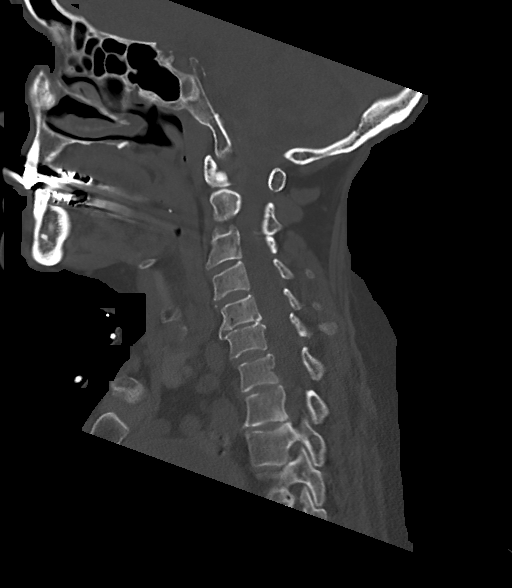
[im 81/121  bone]
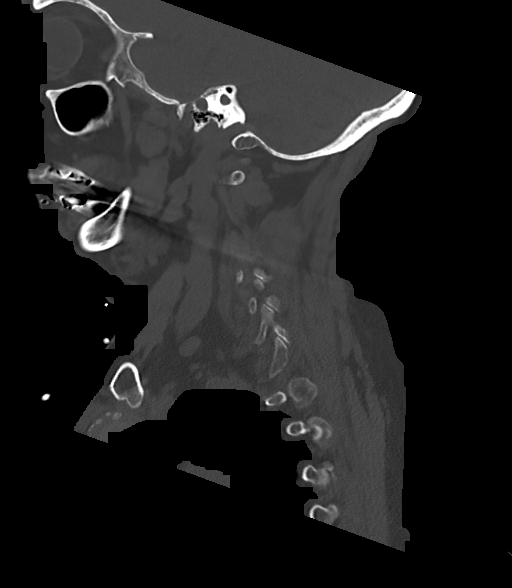

[Series 6: cor bone · coronal · 0.40mm/px · 3 of 121 slices shown]
[im 25/121  bone]
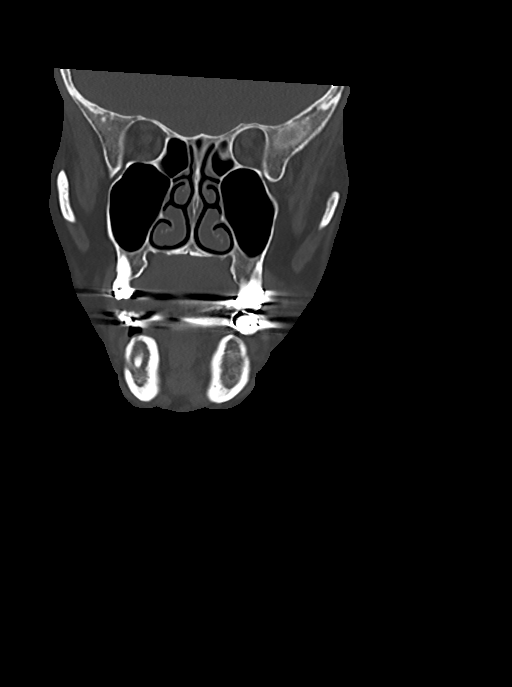
[im 49/121  bone]
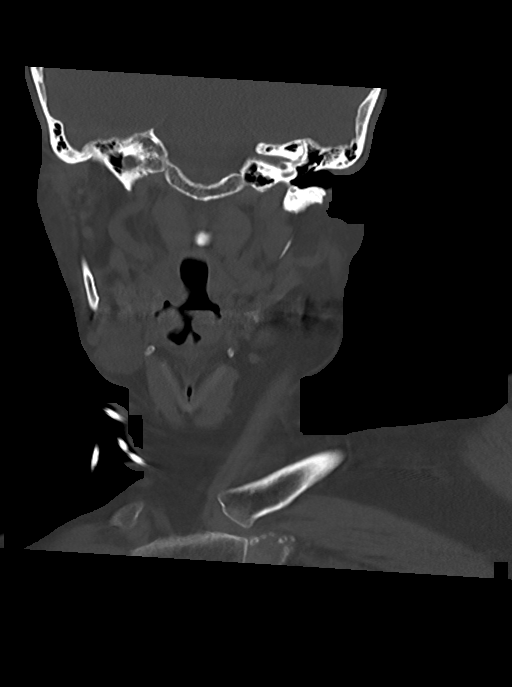
[im 73/121  bone]
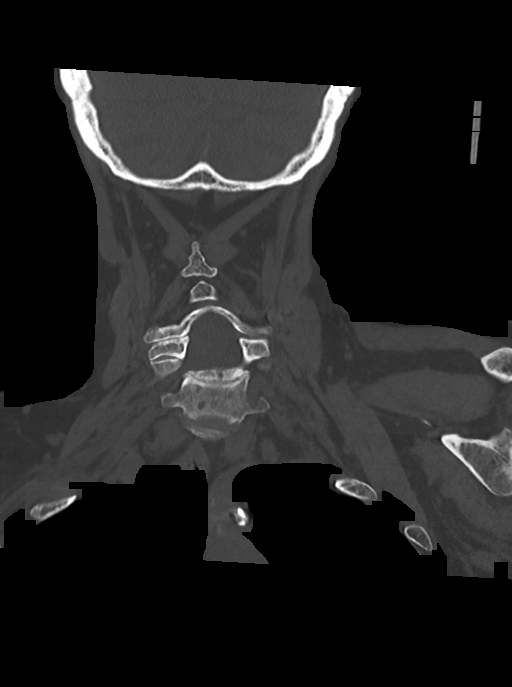

[Series 7: orthogonal axials · axial · 0.30mm/px · z∈[-200,-72]mm · 5 of 97 slices shown, 7 images]
[im 17/97  soft-tissue]
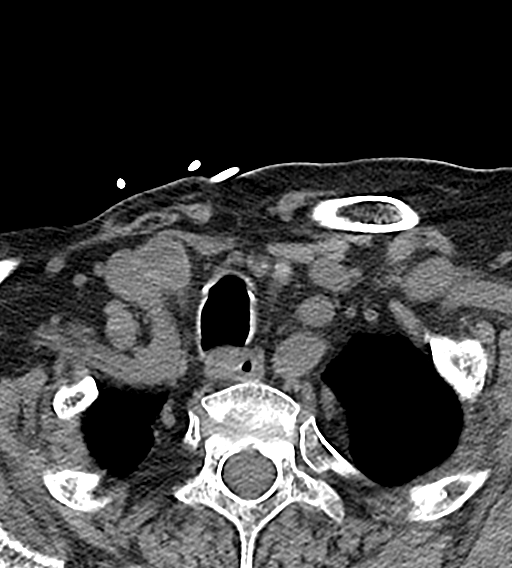
[im 17/97  bone]
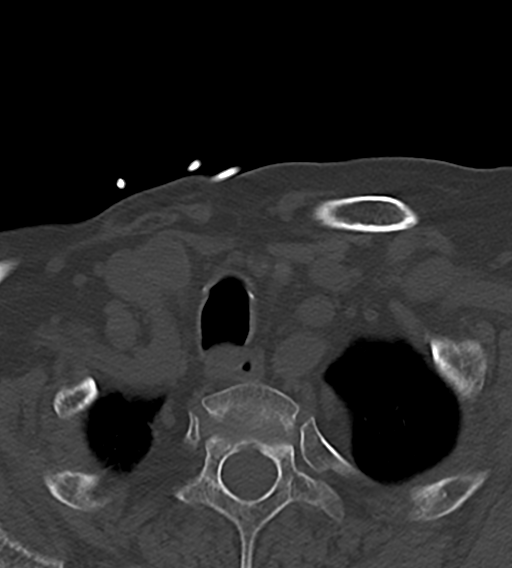
[im 33/97  bone]
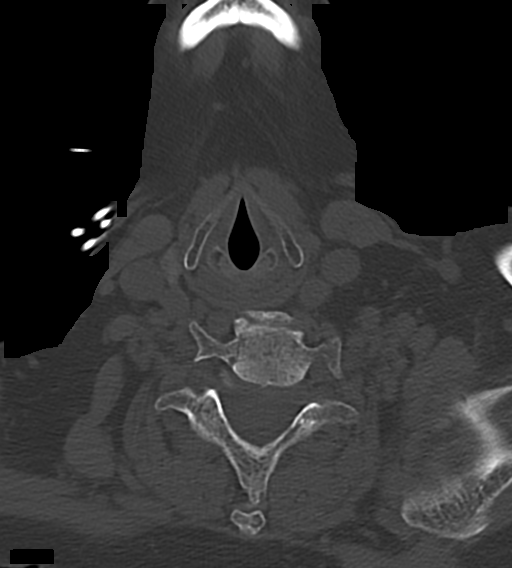
[im 49/97  bone]
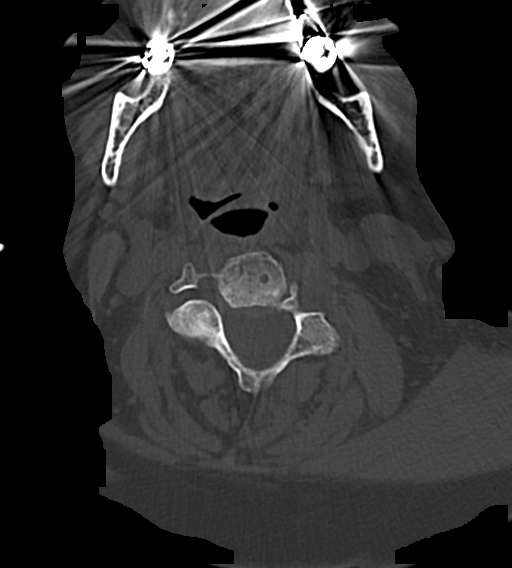
[im 65/97  bone]
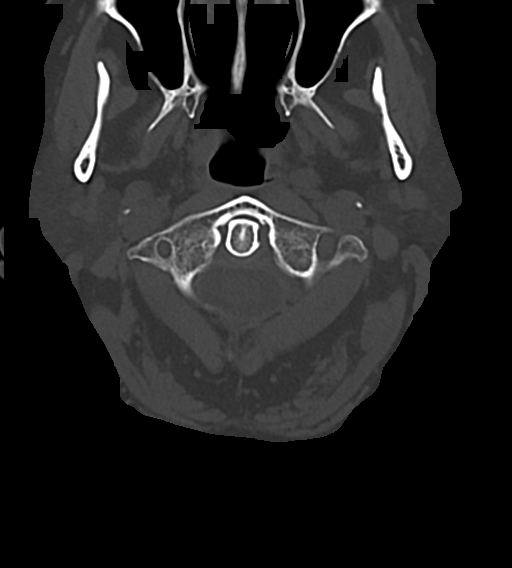
[im 81/97  soft-tissue]
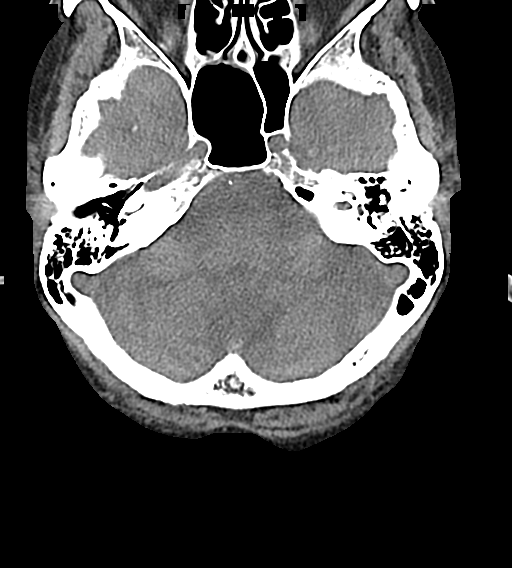
[im 81/97  bone]
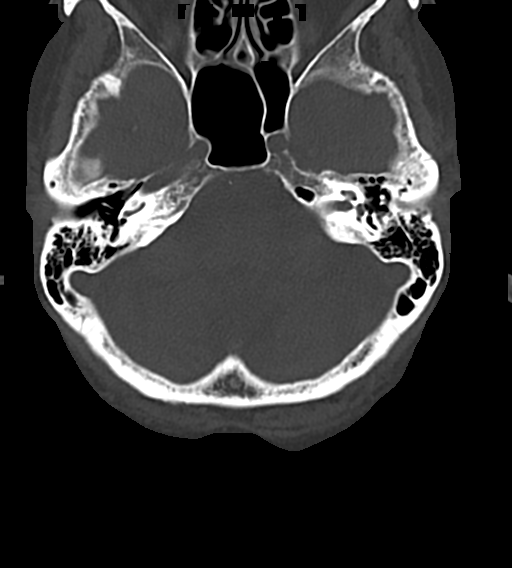

[13 of 33 positions shown; findings below may reference images not displayed]

FINDINGS: CT HEAD FINDINGS

Brain: Sequelae of right parietal tumor resection are again
identified. A cystic area in the right parietal lobe appears larger
than on the prior MRI and may reflect a resection cavity or
cystic/necrotic tumor. Additional low-density in the surrounding
white matter suggestive of edema has mildly increased. There is no
midline shift or other significant mass effect. Trace extra-axial
fluid is noted subjacent to the craniotomy. No acute cortically
based infarct or acute intracranial hemorrhage is identified. The
lateral and third ventricles are normal in size. There is ex vacuo
dilatation of the fourth ventricle related to prominent inferior
cerebellar atrophy.

Vascular: Calcified atherosclerosis at the skull base. No hyperdense
vessel.

Skull: Right parietal craniotomy.

Sinuses/Orbits: Visualized paranasal sinuses and mastoid air cells
are clear. Unremarkable orbits.

Other: None.

CT CERVICAL SPINE FINDINGS

Alignment: Straightening of the normal cervical lordosis. Trace
anterolisthesis of C6 on C7. Left convex curvature of the cervical
spine.

Skull base and vertebrae: No acute fracture or suspicious osseous
lesion.

Soft tissues and spinal canal: No prevertebral fluid or swelling. No
visible canal hematoma.

Disc levels: Focally advanced disc degeneration at C5-6 where there
is severe disc space narrowing and uncovertebral spurring with mild
bilateral neural foraminal stenosis.

Upper chest: Nonspecific mosaic attenuation in the lung apices.

Other: Subcentimeter bilateral thyroid nodules for which no
follow-up imaging is recommended.
IMPRESSION: 1. Right parietal glioblastoma with mildly increased regional low
density which may reflect edema and/or tumor. No significant mass
effect.
2. No evidence of an acute infarct or intracranial hemorrhage.
3. No acute cervical spine fracture.

## 2021-11-03 MED ORDER — ACETAMINOPHEN 325 MG RE SUPP
650.0000 mg | RECTAL | Status: DC | PRN
Start: 2021-11-03 — End: 2021-11-04

## 2021-11-03 MED ORDER — POLYETHYLENE GLYCOL 3350 17 G PO PACK: 17.0000 g | PACK | Freq: Every day | ORAL | Status: DC | PRN

## 2021-11-03 MED ORDER — LEVETIRACETAM IN NACL 1000 MG/100ML IV SOLN
1000.0000 mg | Freq: Once | INTRAVENOUS | Status: AC
  Administered 2021-11-03: 1000 mg via INTRAVENOUS
  Filled 2021-11-03: qty 100

## 2021-11-03 MED ORDER — VALPROIC ACID 250 MG/5ML PO SOLN
500.0000 mg | Freq: Three times a day (TID) | ORAL | Status: DC
  Administered 2021-11-04: 500 mg via ORAL
  Filled 2021-11-03 (×3): qty 10

## 2021-11-03 MED ORDER — ORAL CARE MOUTH RINSE
15.0000 mL | OROMUCOSAL | Status: DC
  Filled 2021-11-03 (×8): qty 15

## 2021-11-03 MED ORDER — LEVETIRACETAM IN NACL 1000 MG/100ML IV SOLN
1000.0000 mg | Freq: Two times a day (BID) | INTRAVENOUS | Status: DC
  Administered 2021-11-04: 1000 mg via INTRAVENOUS
  Filled 2021-11-03: qty 100

## 2021-11-03 MED ORDER — TETANUS-DIPHTH-ACELL PERTUSSIS 5-2.5-18.5 LF-MCG/0.5 IM SUSY
0.5000 mL | PREFILLED_SYRINGE | Freq: Once | INTRAMUSCULAR | Status: AC
  Administered 2021-11-03: 0.5 mL via INTRAMUSCULAR
  Filled 2021-11-03: qty 0.5

## 2021-11-03 MED ORDER — LEVETIRACETAM IN NACL 1000 MG/100ML IV SOLN: 1000.0000 mg | Freq: Once | INTRAVENOUS | Status: DC

## 2021-11-03 MED ORDER — ENOXAPARIN SODIUM 40 MG/0.4ML IJ SOSY: 40.0000 mg | PREFILLED_SYRINGE | INTRAMUSCULAR | Status: DC

## 2021-11-03 MED ORDER — ONDANSETRON HCL 4 MG/2ML IJ SOLN: 4.0000 mg | Freq: Four times a day (QID) | INTRAMUSCULAR | Status: DC | PRN

## 2021-11-03 MED ORDER — SODIUM CHLORIDE 0.9 % IV SOLN
75.0000 mL/h | INTRAVENOUS | Status: DC
  Administered 2021-11-03 – 2021-11-04 (×2): 75 mL/h via INTRAVENOUS

## 2021-11-03 MED ORDER — POTASSIUM CHLORIDE CRYS ER 20 MEQ PO TBCR
40.0000 meq | EXTENDED_RELEASE_TABLET | Freq: Once | ORAL | Status: AC
  Administered 2021-11-03: 40 meq via ORAL
  Filled 2021-11-03: qty 2

## 2021-11-03 MED ORDER — ACETAMINOPHEN 325 MG PO TABS: 650.0000 mg | ORAL_TABLET | ORAL | Status: DC | PRN

## 2021-11-03 MED ORDER — LORAZEPAM 2 MG/ML IJ SOLN
2.0000 mg | INTRAMUSCULAR | Status: DC | PRN
  Administered 2021-11-03: 2 mg via INTRAVENOUS

## 2021-11-03 MED ORDER — CHLORHEXIDINE GLUCONATE 0.12% ORAL RINSE (MEDLINE KIT)
15.0000 mL | Freq: Two times a day (BID) | OROMUCOSAL | Status: DC
  Filled 2021-11-03 (×2): qty 15

## 2021-11-03 MED ORDER — VALPROATE SODIUM 100 MG/ML IV SOLN
1500.0000 mg | Freq: Once | INTRAVENOUS | Status: AC
  Administered 2021-11-03: 1500 mg via INTRAVENOUS
  Filled 2021-11-03: qty 15

## 2021-11-03 MED ORDER — PANTOPRAZOLE SODIUM 40 MG PO TBEC: 40.0000 mg | DELAYED_RELEASE_TABLET | Freq: Every day | ORAL | Status: DC

## 2021-11-03 MED ORDER — LORAZEPAM 2 MG/ML IJ SOLN
4.0000 mg | INTRAMUSCULAR | Status: DC | PRN
  Filled 2021-11-03: qty 2

## 2021-11-03 MED ORDER — ONDANSETRON HCL 4 MG PO TABS: 4.0000 mg | ORAL_TABLET | Freq: Four times a day (QID) | ORAL | Status: DC | PRN

## 2021-11-03 NOTE — Progress Notes (Signed)
Eeg done 

## 2021-11-03 NOTE — Assessment & Plan Note (Signed)
-   Of the right shoulder ?

## 2021-11-03 NOTE — ED Notes (Signed)
Pt needing assistance to use the bathroom. Pt raised arm in the air and started screaming as her eyes rolled back in her head. Pt teeth clinched her tongue and her entire body started shaking. Seizure pads in place at time of seizure. Pt placed on two liters O2. Provider notified. ?

## 2021-11-03 NOTE — Hospital Course (Signed)
Ms. Nina Johnson is a 71 year old female with history of GERD, microcytic anemia, glioblastoma status post craniotomy in January 2023 and revision of dehiscence from craniotomy in February 2023, new onset seizure in December 2022, osteoarthritis, who presents emergency department from home for chief concerns of seizures. ? ?Initial vitals in the emergency department showed temperature of 97.9, respiration rate of 15, heart rate of 88, blood pressure 134/70, SPO2 of 99% on room air. ? ?Serum sodium 139, potassium 2.4, chloride 107, bicarb 25, BUN of 12, serum creatinine of 0.73, nonfasting blood glucose 101, GFR greater than 60. ? ?WBC of 6, hemoglobin 7.8, platelets of 469.   ? ?Magnesium level was within normal limits at 2.3. ? ?ED treatment: Potassium chloride 40 mill equivalent p.o. one-time dose, Keppra 1 g IV one-time dose. ?

## 2021-11-03 NOTE — ED Notes (Signed)
Consult request made to [facility] ?

## 2021-11-03 NOTE — Assessment & Plan Note (Signed)
-   Pantoprazole 40 mg daily resumed ?

## 2021-11-03 NOTE — ED Notes (Signed)
CT to [facility] images to [facility] ?

## 2021-11-03 NOTE — Assessment & Plan Note (Signed)
-   Status postcraniotomy in January 2022 ?- Continue follow-up with outpatient oncology at [facility] ?

## 2021-11-03 NOTE — Discharge Instructions (Signed)
?  IMPRESSION: ?1. Right parietal glioblastoma with mildly increased regional low ?density which may reflect edema and/or tumor. No significant mass ?effect. ?2. No evidence of an acute infarct or intracranial hemorrhage. ?3. No acute cervical spine fracture. ?  ?  ?

## 2021-11-03 NOTE — ED Notes (Signed)
Pt asleep at this time. Pt on the monitor and seizure pads attached to bed railings. ?

## 2021-11-03 NOTE — ED Provider Notes (Signed)
? ?[facility] ?Provider Note ? ? ? Event Date/Time  ? First MD Initiated Contact with Patient 11/03/21 1533   ?  (approximate) ? ? ?History  ? ?Seizures ? ? ?HPI ? ?Nina Johnson is a 71 y.o. female  Right -handed female with h/o scoliosis, OA Right shoulder, anxiety, new onset seizure (08/24/2021) resulting in fall and closed displaced fracture of Right glenoid cavity, Right parietal GBM s/p craniotomy who comes in with seizure.  Patient is followed over at [facility].  Patient reports that she was initially on Keppra 1 g but recently reduced to 500 mg.  She denies having any seizures when she was on the 1 g twice daily.  Today family witnessed a seizure.  Family not at bedside so unclear how long it lasted but no medications were needed to be given by EMS.  She was postictal with EMS but was more alert with [place] here.  She is unsure if she had any fall and hit her head.  She denies any significant symptoms right now. ? ? ?Physical Exam  ? ?Triage Vital Signs: ?Temperature 97.9 ?F (36.6 ?C), temperature source Oral, height '5\' 2"'$  (1.575 m), weight 63.8 kg. ? ? ?Most recent vital signs: ?Vitals:  ? 11/03/21 1600 11/03/21 1630  ?BP: 134/70 135/74  ?Pulse: 88 85  ?Resp: 15 16  ?Temp:    ?SpO2: 99% 97%  ? ? ? ?General: Awake, no distress.  ?CV:  Good peripheral perfusion.  ?Resp:  Normal effort.  ?Abd:  No distention.  ?Other:  Old craniotomy scar is noted.  CN nerves are intact with equal strength in arms and legs.  Patient has hematoma on the right side of her head ? ? ?ED Results / Procedures / Treatments  ? ?Labs ?(all labs ordered are listed, but only abnormal results are displayed) ?Labs Reviewed  ?CBC WITH DIFFERENTIAL/PLATELET  ?COMPREHENSIVE METABOLIC PANEL  ?URINALYSIS, ROUTINE W REFLEX MICROSCOPIC  ? ? ? ?EKG ? ?My interpretation of EKG: ? ?Sinus rhythm rate of 85 without any ST elevation or T wave inversions, normal intervals ? ?RADIOLOGY ?I have reviewed the  CT head which shows the right  parietal glioblastoma with no significant mass effect but a little bit of edema versus tumor. ? ? ?PROCEDURES: ? ?Critical Care performed: No ? ?.1-3 Lead EKG Interpretation ?Performed by: Vanessa Northlake, MD ?Authorized by: Vanessa Cuyuna, MD  ? ?  Interpretation: normal   ?  ECG rate:  85 ?  ECG rate assessment: normal   ?  Rhythm: sinus rhythm   ?  Ectopy: none   ?  Conduction: normal   ? ? ?MEDICATIONS ORDERED IN ED: ?Medications  ?levETIRAcetam (KEPPRA) IVPB 1000 mg/100 mL premix (has no administration in time range)  ? ? ? ?IMPRESSION / MDM / ASSESSMENT AND PLAN / ED COURSE  ?I reviewed the triage vital signs and the nursing notes. ?             ?               ? ?Patient with known recent glioblastoma resection who comes in with concerns for seizure in the setting of having her Keppra recently weaned down to 500 twice a day.  We will give patient 1 g of Keppra get CT head to evaluate for worsening edema and CT cervical given unclear if she had a fall and basic labs to evaluate for any electrolyte abnormalities, EKG evaluate for any arrhythmia although seems less likely.  We will keep patient the cardiac monitor. ? ?Family later came to the room and stated that she did a lot of bedside lab we got the CT head and CT cervical given concerns for hematoma on the head. ? ?Patient potassium slightly low will give some oral repletion.  Her CBC shows hemoglobin of 7.8 but this is stable on review of her records from [facility] on 2/11 she was 7.7.  Her white count is normal no signs for an infectious process. ? ?I reviewed her oncology note where it looks like patient was told to be on Keppra 500 twice daily on 3/3 and she is not on dexamethasone.  She is currently receiving radiation therapy and daily tamoxifen alone wide ? ?Discussed case with Dr. Lacinda Axon who recommended going up on her Keppra but no indication for dexamethasone decreasing seizures and this can be discussed with her oncologist.  I planned for patient to be  discharged home ? ?5:44 PM pt had another 45 second seizure.  Seizure broke without any interventions.  On reassessment patient is postictal but able to open up her eyes and respond to questions.  Patient is not having nonconvulsive seizing ? ? ?Discussed the case with Dr. Cheral Marker who recommended continuing the 1000 mg twice daily of Keppra but no need for MRI at this time and admitting for observation overnight ? ?The patient is on the cardiac monitor to evaluate for evidence of arrhythmia and/or significant heart rate changes. ? ?FINAL CLINICAL IMPRESSION(S) / ED DIAGNOSES  ? ?Final diagnoses:  ?Seizure ([place])  ?Glioblastoma ([place])  ? ? ? ?Rx / DC Orders  ? ?ED Discharge Orders   ? ? None  ? ?  ? ? ? ?Note:  This document was prepared using Dragon voice recognition software and may include unintentional dictation errors. ?  ?Vanessa Browning, MD ?11/03/21 1833 ? ?

## 2021-11-03 NOTE — ED Notes (Signed)
Pt very fidgety. Unable to stay still, states her legs feel jumpy. ?

## 2021-11-03 NOTE — ED Notes (Signed)
In and Out cath done to obtain urine. ?

## 2021-11-03 NOTE — Assessment & Plan Note (Addendum)
-   Presumed secondary to seizure activity ?

## 2021-11-03 NOTE — ED Triage Notes (Signed)
Pt ems from home for seizure. Pt has hx of brain tumor with craniotomy in January. Per ems pt had a full body seizure. No meds were given. Pt a/o now, vss.   ?

## 2021-11-03 NOTE — H&P (Addendum)
Addendum: Received secure chat message from Dr. Zeb Comfort, EEG interpreting neurologist, who states that patient is: showing evidence of epileptogenicity arising from the right hemisphere likely secondary to underlying craniotomy.  Additional brief ictal interictal discharges which is on the ictal interictal continuum with increased risk of seizures.    Recommend IV Ativan stat and prolonged EEG monitoring.  Dr. Cheral Marker has been notified and recommends transfer to [facility] for continuous EEG monitoring.  # Seizure activity- - At bedside patient appears calm, no acute distress, states she is feeling fine, able to correctly tell me her name, age, location.  However per nursing staff, she does appear to be compensating.  She has loops of memory disturbances, with nursing able to redirect her that she cannot try to get out of bed due to risk of a fall.  She asked to go to the restroom and when nursing staff helps her to the restroom she ask nursing staff where they are taking her. - Ativan 2 mg IV stat, discussed with nursing staff at [facility] consulted for transfer to [facility] for continuous EEG monitoring - I called [facility], and they do not have a PCU/med telemetry/cardiac telemetry bed available. They agree that patient needs to be admitted for continuous EEG monitoring however recommends that we call back in the a.m. to in case there is bed availability.  Patient has not been accepted at [facility]. - I called [facility], and specifically for continuous EEG monitoring, they will need to discuss with their chief of medical staff prior to accepting a patient.  [facility] can give update at 7 AM at the earliest, on 11/04/2021.  [facility] is requesting we call back in order for updates on whether patient can be accepted or not pending their Chief of medical staff decision.  In addition they request that if patient is excepted and transferred elsewhere, that we update [facility] bed placement service. - Patient  has been accepted to [facility], pending bed availability   History and Physical   Nina Johnson WNU:[id number] DOB: 1950-12-27 DOA: 11/03/2021  PCP: System, Provider Not In  Outpatient Specialists: Dr. Baruch Goldmann, [facility] Patient coming from: Home the EMS  I have personally briefly reviewed patient's old medical records in [place].  Chief Concern: Seizure  HPI: Ms. Nina Johnson is a 71 year old female with history of GERD, microcytic anemia, glioblastoma status post craniotomy in January 2023 and revision of dehiscence from craniotomy in February 2023, new onset seizure in December 2022, osteoarthritis, who presents emergency department from home for chief concerns of seizures.  Initial vitals in the emergency department showed temperature of 97.9, respiration rate of 15, heart rate of 88, blood pressure 134/70, SPO2 of 99% on room air.  Serum sodium 139, potassium 2.4, chloride 107, bicarb 25, BUN of 12, serum creatinine of 0.73, nonfasting blood glucose 101, GFR greater than 60.  WBC of 6, hemoglobin 7.8, platelets of 469.    Magnesium level was within normal limits at 2.3.  ED treatment: Potassium chloride 40 mill equivalent p.o. one-time dose, Keppra 1 g IV one-time dose. ----------- At bedside, she was able to tell me her age, name, current calendar year, and current location.   She states she was daignosed with brain tumor on January 5 of 2023 and had brain surgery the next day. She had repeat brain in October 04, 2021.   She endorses that she missed several evening dose of her seizure evening doses, for two evenings  last week.  She reports that the seizure today, she was told that she had seizures for about 2 minutes.  She did not have urinary or bowel incontinence during the seizure.  She states she was not aware that she was having a seizure.  At bedside she appears to be comfortable and not in acute distress.  She denies any chest pain, shortness  of breath, abdominal pain, dysuria, hematuria, diarrhea, swelling in her legs, dysphagia, nausea, vomiting, fever, cough, chills.  Social history: She lives at home with her daughter. She denies history of tobacco, etoh, recreational drug use. She is retired and formerly worked as a Emergency planning/management officer.  Family history: She reports that she has a daughter that has grand-mal seizure   Vaccination history: She is not vaccinated for influenza or covid 19  ROS: Constitutional: no weight change, no fever ENT/Mouth: no sore throat, no rhinorrhea Eyes: no eye pain, no vision changes Cardiovascular: no chest pain, no dyspnea,  no edema, no palpitations Respiratory: no cough, no sputum, no wheezing Gastrointestinal: no nausea, no vomiting, no diarrhea, no constipation Genitourinary: no urinary incontinence, no dysuria, no hematuria Musculoskeletal: no arthralgias, no myalgias Skin: no skin lesions, no pruritus, Neuro: + weakness, no loss of consciousness, no syncope Psych: no anxiety, no depression, + decrease appetite Heme/Lymph: no bruising, no bleeding  ED Course: Discussed with emergency medicine provider, patient requiring hospitalization for chief concerns of seizure/breakthrough seizure  Assessment/Plan  Principal Problem:   Seizure ([place]) Active Problems:   Epilepsy ([place])   Glioblastoma ([place])   GERD (gastroesophageal reflux disease)   OA (osteoarthritis)   Tongue biting    * Seizure ([place]) Assessment & Plan - Patient is currently on Keppra 500 mg p.o. twice daily, patient did take her a.m. dose - Previously patient was on Keppra 1000 mg p.o. twice daily, has been weaned down to 500 mg p.o. twice daily about 1 month ago - Patient did endorse missing a couple of p.m. Keppra dose over the last week - We will check Keppra/Levetiracetam levels, add to prior collection - Neurology, Dr. Cheral Marker has been consulted and we appreciate further recommendations - Patient is status post Keppra 1 g  IV per EDP - Consulted neurologist has ordered loading dose of Valproic acid at 1500 mg, IV at 65 ml/hr - Keppra 1 g IV twice daily ordered starting on 11/04/2021 - Ativan 4 mg IV as needed for breakthrough seizures ordered - Seizure precautions, fall precautions - Admit to progressive cardiac unit, observation with telemetry  Digestive Tongue biting Assessment & Plan - Presumed secondary to seizure activity  GERD (gastroesophageal reflux disease) Assessment & Plan - Pantoprazole 40 mg daily resumed  Musculoskeletal and Integument OA (osteoarthritis) Assessment & Plan - Of the right shoulder  Other Glioblastoma [facility]) Assessment & Plan - Status postcraniotomy in January 2022 - Continue follow-up with outpatient oncology at [facility]  Patient currently does not have healthcare power of attorney.  Her cousin Esmeralda Malay states they are in the process of helping her establish a healthcare power of attorney as her daughter who lives with her would not be able to help her make decisions.  Chart reviewed.   DVT prophylaxis: Enoxaparin Code Status: Full code Diet: npo except for sips of meds and ice chips. Family Communication: Patient states that her family knows that she is in the hospital and does not require me to call. Cousin who is her emergency contact, Shany Marinez, needs to be called at [phone].  Disposition Plan: Pending  clinical course Consults called: Neurology Admission status: Progressive cardiac, observation, telemetry  Past Medical History:  Diagnosis Date   Dehiscence of closure of skull or craniotomy 10/04/2021   Dehiscence of closure of skull or craniotomy 08/30/2021   Glioblastoma ([place])    Past Surgical History:  Procedure Laterality Date   CRANIOTOMY     Social History:  reports that she has never smoked. She has never used smokeless tobacco. She reports that she does not drink alcohol and does not use drugs.  No Known Allergies Family History  Problem  Relation Age of Onset   Cancer Other    Seizures Child    Family history: Family history reviewed and not pertinent  Prior to Admission medications   Medication Sig Start Date End Date Taking? Authorizing Provider  acetaminophen (TYLENOL) 650 MG CR tablet Take by mouth.    [provider]  DOCUSATE SODIUM PO Take by mouth every other day.    [provider]  levETIRAcetam (KEPPRA) 500 MG tablet SMARTSIG:1 Tablet(s) By Mouth Every 12 Hours 09/20/21   [provider]  ondansetron (ZOFRAN) 8 MG tablet Take 1 tablet (8 mg total) by mouth 2 (two) times daily as needed (nausea and vomiting). May take 30-60 minutes prior to Temodar administration if nausea/vomiting occurs. 10/25/21   Vaslow, Acey Lav, MD  ondansetron (ZOFRAN-ODT) 4 MG disintegrating tablet Take by mouth.    [provider]  oxyCODONE (OXY IR/ROXICODONE) 5 MG immediate release tablet Take by mouth.    [provider]  pantoprazole (PROTONIX) 40 MG tablet Take by mouth. 10/06/21 10/06/22  [provider]  temozolomide (TEMODAR) 100 MG capsule Take 1 capsule (100 mg total) by mouth daily. May take on an empty stomach to decrease nausea & vomiting. 10/25/21   Ventura Sellers, MD  temozolomide (TEMODAR) 20 MG capsule Take 1 capsule (20 mg total) by mouth daily. May take on an empty stomach to decrease nausea & vomiting. 10/25/21   Ventura Sellers, MD   Physical Exam: Vitals:   11/03/21 1539 11/03/21 1540 11/03/21 1600 11/03/21 1630  BP:   134/70 135/74  Pulse:   88 85  Resp:   15 16  Temp: 97.9 F (36.6 C)     TempSrc: Oral     SpO2:   99% 97%  Weight:  63.8 kg    Height:  '5\' 2"'$  (1.575 m)     Constitutional: appears younger than chronological age, NAD, calm, comfortable Eyes: PERRL, lids and conjunctivae normal ENMT: Mucous membranes are moist. Posterior pharynx clear of any exudate or lesions. Age-appropriate dentition. Hearing appropriate Neck: normal, supple, no masses, no  thyromegaly Respiratory: clear to auscultation bilaterally, no wheezing, no crackles. Normal respiratory effort. No accessory muscle use.  Cardiovascular: Regular rate and rhythm, no murmurs / rubs / gallops. No extremity edema. 2+ pedal pulses. No carotid bruits.  Abdomen: no tenderness, no masses palpated, no hepatosplenomegaly. Bowel sounds positive.  Musculoskeletal: no clubbing / cyanosis. No joint deformity upper and lower extremities. Good ROM, no contractures, no atrophy. Normal muscle tone.  Skin: no rashes, lesions, ulcers. No induration Neurologic: Sensation intact. Strength 5/5 in all 4.  Psychiatric: Normal judgment and insight. Alert and oriented x 3. Normal mood.   EKG: independently reviewed, showing sinus rhythm with rate of 85, QTc 446  Chest x-ray on Admission: I personally reviewed and I agree with radiologist reading as below.  CT HEAD WO CONTRAST (5MM)  Result Date: 11/03/2021 CLINICAL DATA:  Head trauma.  Seizure.  History of glioblastoma. EXAM: CT HEAD WITHOUT CONTRAST CT CERVICAL SPINE WITHOUT CONTRAST TECHNIQUE: Multidetector CT imaging of the head and cervical spine was performed following the standard protocol without intravenous contrast. Multiplanar CT image reconstructions of the cervical spine were also generated. RADIATION DOSE REDUCTION: This exam was performed according to the departmental dose-optimization program which includes automated exposure control, adjustment of the mA and/or kV according to patient size and/or use of iterative reconstruction technique. COMPARISON:  10/05/2021 head MRI from [facility]. 08/30/2021 head CT from [facility]. FINDINGS: CT HEAD FINDINGS Brain: Sequelae of right parietal tumor resection are again identified. A cystic area in the right parietal lobe appears larger than on the prior MRI and may reflect a resection cavity or cystic/necrotic tumor. Additional low-density in the surrounding white matter suggestive of edema  has mildly increased. There is no midline shift or other significant mass effect. Trace extra-axial fluid is noted subjacent to the craniotomy. No acute cortically based infarct or acute intracranial hemorrhage is identified. The lateral and third ventricles are normal in size. There is ex vacuo dilatation of the fourth ventricle related to prominent inferior cerebellar atrophy. Vascular: Calcified atherosclerosis at the skull base. No hyperdense vessel. Skull: Right parietal craniotomy. Sinuses/Orbits: Visualized paranasal sinuses and mastoid air cells are clear. Unremarkable orbits. Other: None. CT CERVICAL SPINE FINDINGS Alignment: Straightening of the normal cervical lordosis. Trace anterolisthesis of C6 on C7. Left convex curvature of the cervical spine. Skull base and vertebrae: No acute fracture or suspicious osseous lesion. Soft tissues and spinal canal: No prevertebral fluid or swelling. No visible canal hematoma. Disc levels: Focally advanced disc degeneration at C5-6 where there is severe disc space narrowing and uncovertebral spurring with mild bilateral neural foraminal stenosis. Upper chest: Nonspecific mosaic attenuation in the lung apices. Other: Subcentimeter bilateral thyroid nodules for which no follow-up imaging is recommended. IMPRESSION: 1. Right parietal glioblastoma with mildly increased regional low density which may reflect edema and/or tumor. No significant mass effect. 2. No evidence of an acute infarct or intracranial hemorrhage. 3. No acute cervical spine fracture. Electronically Signed   By: Logan Bores M.D.   On: 11/03/2021 16:32   CT Cervical Spine Wo Contrast  Result Date: 11/03/2021 CLINICAL DATA:  Head trauma.  Seizure.  History of glioblastoma. EXAM: CT HEAD WITHOUT CONTRAST CT CERVICAL SPINE WITHOUT CONTRAST TECHNIQUE: Multidetector CT imaging of the head and cervical spine was performed following the standard protocol without intravenous contrast. Multiplanar CT image  reconstructions of the cervical spine were also generated. RADIATION DOSE REDUCTION: This exam was performed according to the departmental dose-optimization program which includes automated exposure control, adjustment of the mA and/or kV according to patient size and/or use of iterative reconstruction technique. COMPARISON:  10/05/2021 head MRI from [facility]. 08/30/2021 head CT from [facility]. FINDINGS: CT HEAD FINDINGS Brain: Sequelae of right parietal tumor resection are again identified. A cystic area in the right parietal lobe appears larger than on the prior MRI and may reflect a resection cavity or cystic/necrotic tumor. Additional low-density in the surrounding white matter suggestive of edema has mildly increased. There is no midline shift or other significant mass effect. Trace extra-axial fluid is noted subjacent to the craniotomy. No acute cortically based infarct or acute intracranial hemorrhage is identified. The lateral and third ventricles are normal in size. There is ex vacuo dilatation of the fourth ventricle related to prominent inferior cerebellar atrophy. Vascular: Calcified atherosclerosis at the skull base. No hyperdense vessel.  Skull: Right parietal craniotomy. Sinuses/Orbits: Visualized paranasal sinuses and mastoid air cells are clear. Unremarkable orbits. Other: None. CT CERVICAL SPINE FINDINGS Alignment: Straightening of the normal cervical lordosis. Trace anterolisthesis of C6 on C7. Left convex curvature of the cervical spine. Skull base and vertebrae: No acute fracture or suspicious osseous lesion. Soft tissues and spinal canal: No prevertebral fluid or swelling. No visible canal hematoma. Disc levels: Focally advanced disc degeneration at C5-6 where there is severe disc space narrowing and uncovertebral spurring with mild bilateral neural foraminal stenosis. Upper chest: Nonspecific mosaic attenuation in the lung apices. Other: Subcentimeter bilateral thyroid nodules for  which no follow-up imaging is recommended. IMPRESSION: 1. Right parietal glioblastoma with mildly increased regional low density which may reflect edema and/or tumor. No significant mass effect. 2. No evidence of an acute infarct or intracranial hemorrhage. 3. No acute cervical spine fracture. Electronically Signed   By: Logan Bores M.D.   On: 11/03/2021 16:32    Labs on Admission: I have personally reviewed following labs  CBC: Recent Labs  Lab 11/03/21 1551  WBC 6.0  NEUTROABS 2.9  HGB 7.8*  HCT 28.2*  MCV 75.0*  PLT 678*   Basic Metabolic Panel: Recent Labs  Lab 11/03/21 1551  NA 139  K 3.4*  CL 107  CO2 25  GLUCOSE 101*  BUN 12  CREATININE 0.73  CALCIUM 8.9  MG 2.3   GFR: Estimated Creatinine Clearance: 56.6 mL/min (by C-G formula based on SCr of 0.73 mg/dL).  Liver Function Tests: Recent Labs  Lab 11/03/21 1551  AST 38  ALT 32  ALKPHOS 71  BILITOT 0.5  PROT 6.5  ALBUMIN 3.5   CRITICAL CARE Performed by: Briant Cedar Shawnell Dykes  Total critical care time: 35 minutes  Critical care time was exclusive of separately billable procedures and treating other patients.  Critical care was necessary to treat or prevent imminent or life-threatening deterioration.  Critical care was time spent personally by me on the following activities: development of treatment plan with patient and/or surrogate as well as nursing, discussions with consultants, evaluation of patient's response to treatment, examination of patient, obtaining history from patient or surrogate, ordering and performing treatments and interventions, ordering and review of laboratory studies, ordering and review of radiographic studies, pulse oximetry and re-evaluation of patient's condition.  Dr. Tobie Poet [facility]  If 7PM-7AM, please contact overnight-coverage provider If 7AM-7PM, please contact day coverage provider [email or web address]  11/03/2021, 7:20 PM

## 2021-11-03 NOTE — Assessment & Plan Note (Addendum)
-   Patient is currently on Keppra 500 mg p.o. twice daily, patient did take her a.m. dose ?- Previously patient was on Keppra 1000 mg p.o. twice daily, has been weaned down to 500 mg p.o. twice daily about 1 month ago ?- Patient did endorse missing a couple of p.m. Keppra dose over the last week ?- We will check Keppra/Levetiracetam levels, add to prior collection ?- Neurology, Dr. Cheral Marker has been consulted and we appreciate further recommendations ?- Patient is status post Keppra 1 g IV per EDP ?- Consulted neurologist has ordered loading dose of Depakote ?- Keppra 1 g IV twice daily ordered starting on 11/04/2021 ?- Ativan 4 mg IV as needed for breakthrough seizures ordered ?- Seizure precautions, fall precautions ?- Admit to progressive cardiac unit, observation with telemetry ?

## 2021-11-03 NOTE — Procedures (Signed)
Patient Name: Nina Johnson  ?MRN: [id number]  ?Epilepsy Attending: Lora Havens  ?Referring Physician/Provider: Kerney Elbe, MD ?Date: 11/03/2021 ?Duration: 21.21 mins ? ?Patient history: 71yo F with h/o brain tumor s/p resection, seizure who presented with seizure. EEG to evaluate for seizure ? ?Level of alertness: Awake/ lethargic  ? ?AEDs during EEG study: LEV, VPA ? ?Technical aspects: This EEG study was done with scalp electrodes positioned according to the 10-20 International system of electrode placement. Electrical activity was acquired at a sampling rate of '500Hz'$  and reviewed with a high frequency filter of '70Hz'$  and a low frequency filter of '1Hz'$ . EEG data were recorded continuously and digitally stored.  ? ?Description: The posterior dominant rhythm consists of 8-9 Hz activity of moderate voltage (25-35 uV) seen predominantly in posterior head regions, symmetric and reactive to eye opening and eye closing. EEG also showed lateralized periodic discharges with overriding fast activity at 1-2.5 Hz in right hemisphere at times rhythmic with evolution in morphology, lasting 2-10 seconds. This EEG pattern is consistent with brief ictal-interictal rhythmic discharges ( BIRDs). Hyperventilation and photic stimulation were not performed.    ? ?ABNORMALITY ?- Brief ictal-interictal rhythmic discharges, right hemisphere ? ?IMPRESSION: ?This study showed evidence of epileptogenicity arising from right hemisphere likely secondary to underlying craniotomy. Additionlly there are brief ictal-interictal discharges which is on the ictal-interictal continuum with increased risk of seizures. Recommend IV ativan stat and prolonged EEG monitoring. ? ?Dr Cheral Marker was notified.   ? ?Lora Havens  ? ?

## 2021-11-04 ENCOUNTER — Observation Stay (HOSPITAL_COMMUNITY)
Admission: AD | Admit: 2021-11-04 | Discharge: 2021-11-05 | Disposition: A | Discharge status: Home / Self Care | Payer: Medicare Other | Source: Other Acute Inpatient Hospital | Attending: Internal Medicine | Admitting: Internal Medicine

## 2021-11-04 ENCOUNTER — Observation Stay (HOSPITAL_COMMUNITY): Payer: Medicare Other

## 2021-11-04 DIAGNOSIS — R2681 Unsteadiness on feet: Secondary | ICD-10-CM | POA: Insufficient documentation

## 2021-11-04 DIAGNOSIS — D500 Iron deficiency anemia secondary to blood loss (chronic): Secondary | ICD-10-CM | POA: Diagnosis not present

## 2021-11-04 DIAGNOSIS — C719 Malignant neoplasm of brain, unspecified: Secondary | ICD-10-CM

## 2021-11-04 DIAGNOSIS — R569 Unspecified convulsions: Principal | ICD-10-CM

## 2021-11-04 DIAGNOSIS — G40909 Epilepsy, unspecified, not intractable, without status epilepticus: Secondary | ICD-10-CM

## 2021-11-04 LAB — FERRITIN: Ferritin: 8 ng/mL — ABNORMAL LOW (ref 11–307)

## 2021-11-04 LAB — MAGNESIUM: Magnesium: 2.3 mg/dL (ref 1.7–2.4)

## 2021-11-04 LAB — RETICULOCYTES
Immature Retic Fract: 23.8 % — ABNORMAL HIGH (ref 2.3–15.9)
RBC.: 3.86 MIL/uL — ABNORMAL LOW (ref 3.87–5.11)
Retic Count, Absolute: 70.6 10*3/uL (ref 19.0–186.0)
Retic Ct Pct: 1.8 % (ref 0.4–3.1)

## 2021-11-04 LAB — VITAMIN B12: Vitamin B-12: 216 pg/mL (ref 180–914)

## 2021-11-04 LAB — IRON AND TIBC
Iron: 22 ug/dL — ABNORMAL LOW (ref 28–170)
Saturation Ratios: 6 % — ABNORMAL LOW (ref 10.4–31.8)
TIBC: 400 ug/dL (ref 250–450)
UIBC: 378 ug/dL

## 2021-11-04 LAB — FOLATE: Folate: >23.5 ng/mL (ref >5.9)

## 2021-11-04 IMAGING — MR MR HEAD WO/W CM
9 of 15 series · 31 of 48 positions shown · IV contrast (gadavist)
Comparison: [DATE]

CLINICAL DATA: Breakthrough seizure. History of right status post
section.

EXAM:
MRI HEAD WITHOUT AND WITH CONTRAST
TECHNIQUE: Multiplanar, multiecho pulse sequences of the brain and surrounding
structures were obtained without and with intravenous contrast.
CONTRAST:  6.5mL GADAVIST GADOBUTROL 1 MMOL/ML IV SOLN

[Series 4: DWI · axial · 3.0mm · 1.09mm/px · z∈[-87,+65]mm · 6 of 104 slices shown (1 of 4)]
[im 1/104]
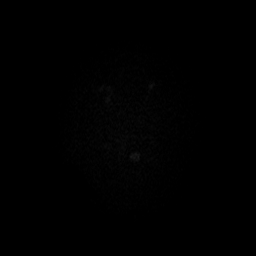
[im 21/104]
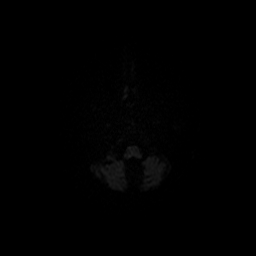
[im 42/104]
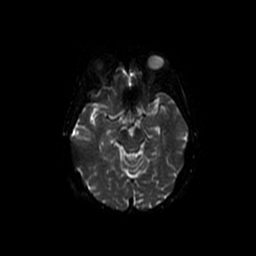
[im 62/104]
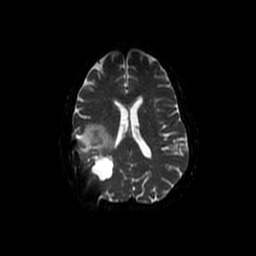
[im 83/104]
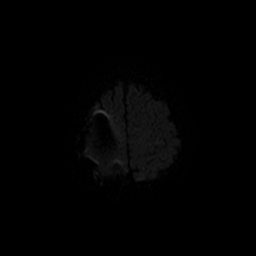
[im 104/104]
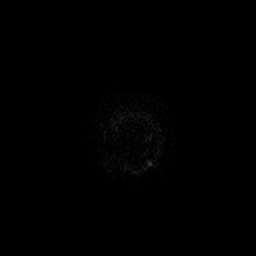

[Series 5: DWI · coronal · 5.0mm · 1.09mm/px · 4 of 75 slices shown (2 of 4)]
[im 1/75]
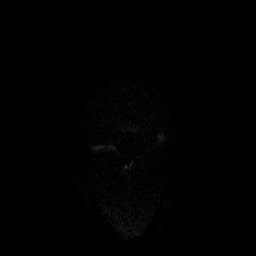
[im 25/75]
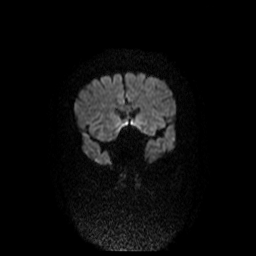
[im 50/75]
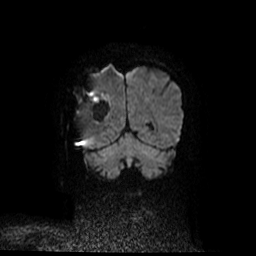
[im 75/75]
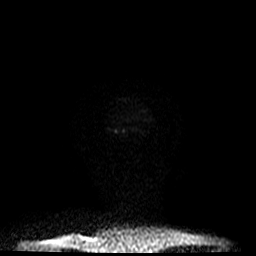

[Series 10: FLAIR · axial · 5.0mm · 0.47mm/px · z∈[-89,+52]mm · 2 of 25 slices shown]
[im 1/25]
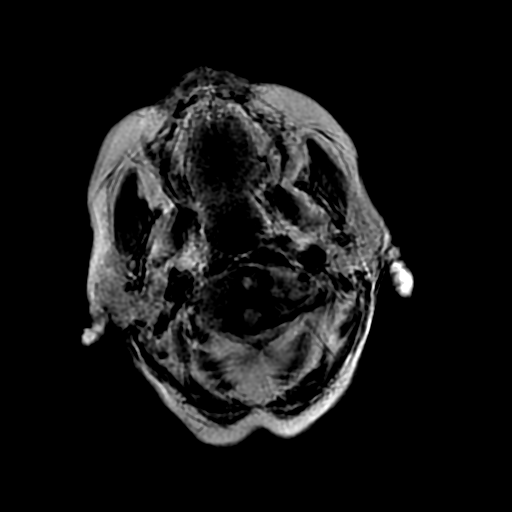
[im 25/25]
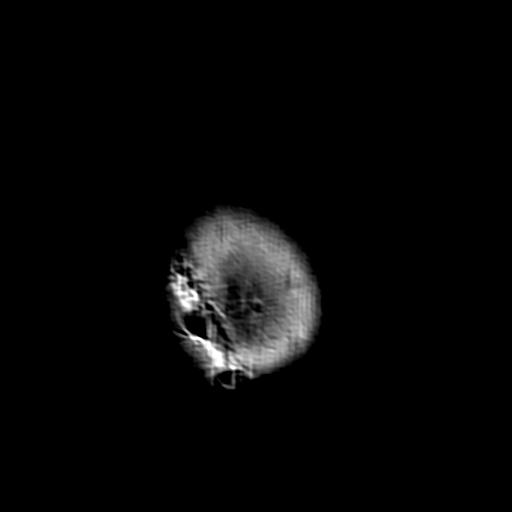

[Series 15: T1 post-contrast · axial · 3.0mm · 0.45mm/px · z∈[-73,+79]mm · 4 of 52 slices shown (1 of 4)]
[im 1/52]
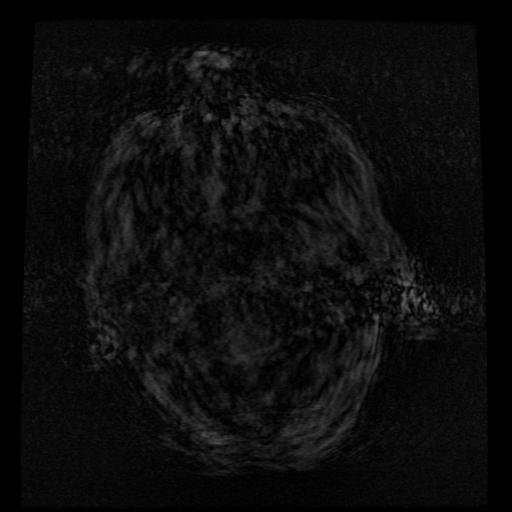
[im 18/52]
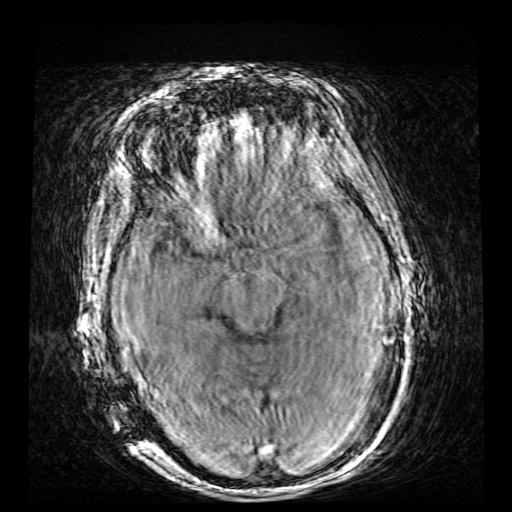
[im 35/52]
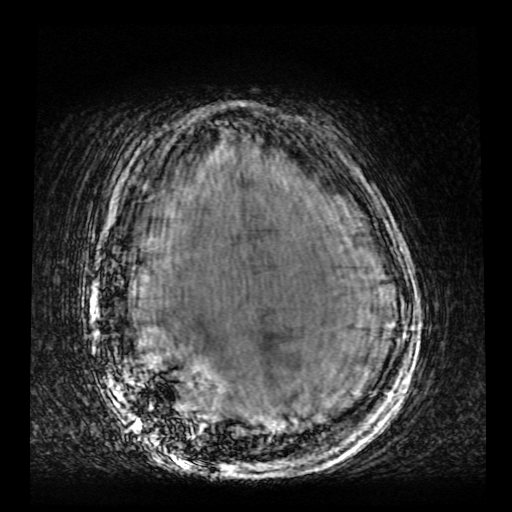
[im 52/52]
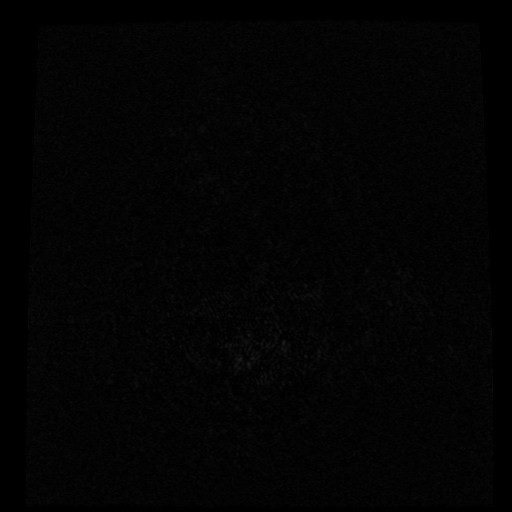

[Series 16: T1 post-contrast · coronal · 5.0mm · 0.39mm/px · 2 of 29 slices shown (2 of 4)]
[im 1/29]
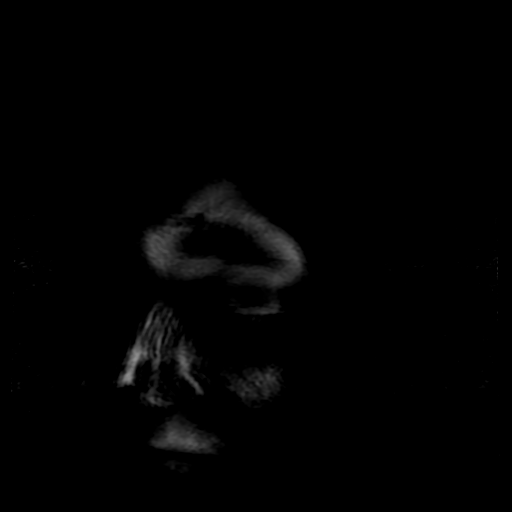
[im 29/29]
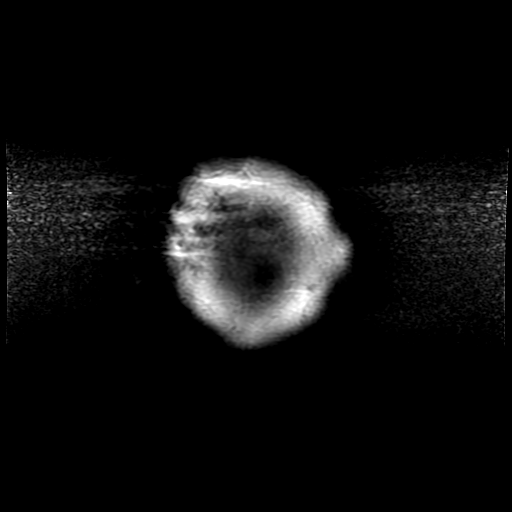

[Series 18: T1 post-contrast · axial · 3.0mm · 0.45mm/px · z∈[-76,+76]mm · 4 of 52 slices shown (3 of 4)]
[im 1/52]
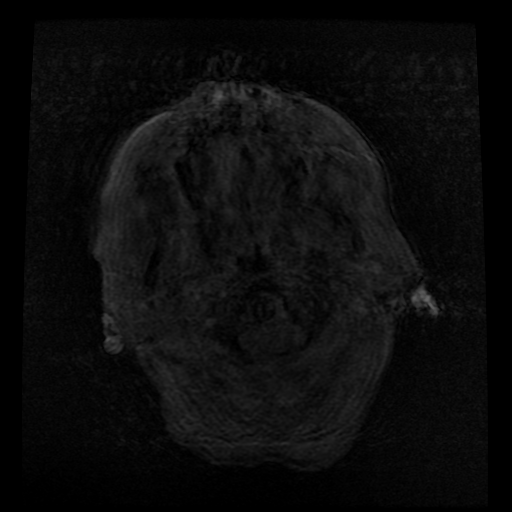
[im 18/52]
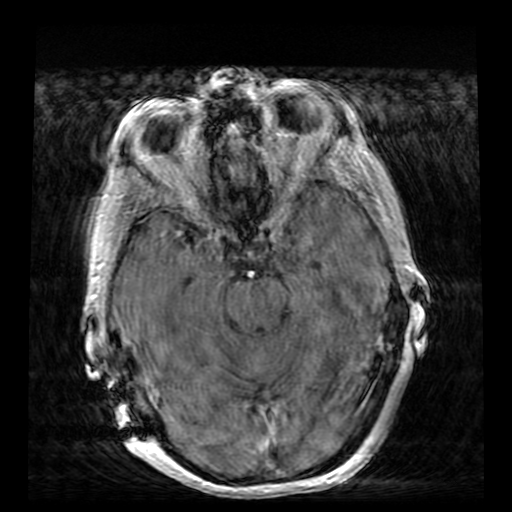
[im 35/52]
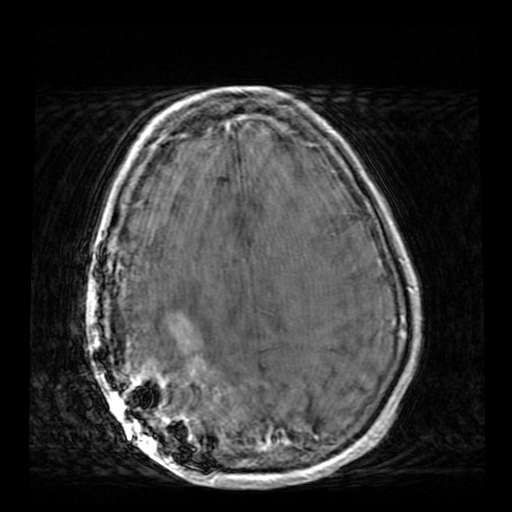
[im 52/52]
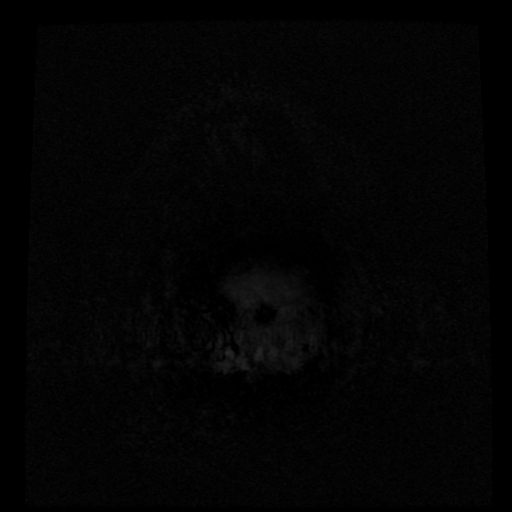

[Series 19: T1 post-contrast · sagittal · 5.0mm · 0.47mm/px · 2 of 26 slices shown (4 of 4)]
[im 1/26]
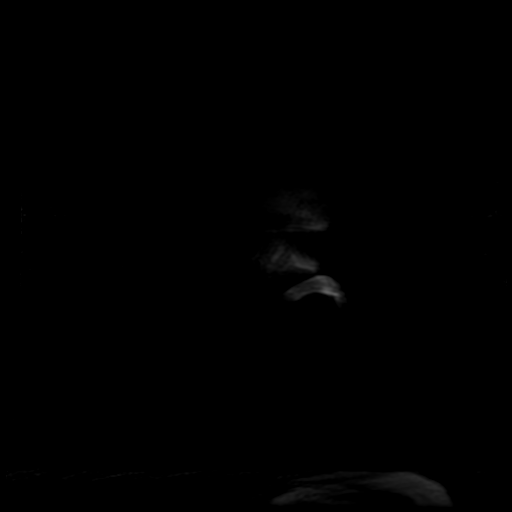
[im 26/26]
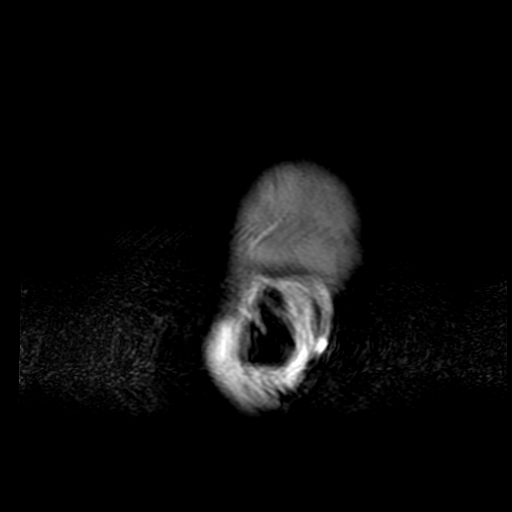

[Series 400: DWI · axial · 3.0mm · 1.09mm/px · z∈[-87,+65]mm · 4 of 52 slices shown (3 of 4)]
[im 1/52]
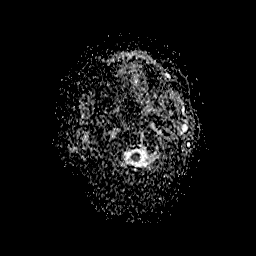
[im 18/52]
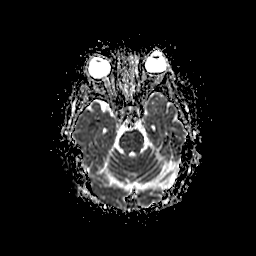
[im 35/52]
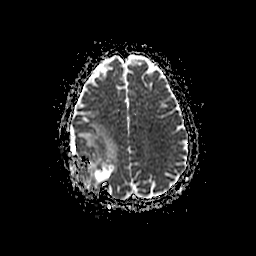
[im 52/52]
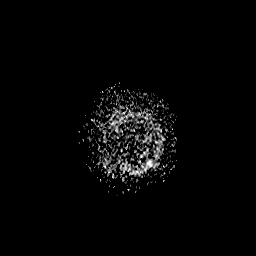

[Series 500: DWI · coronal · 5.0mm · 1.09mm/px · 3 of 38 slices shown (4 of 4)]
[im 1/38]
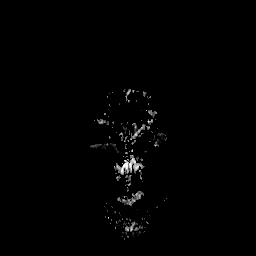
[im 19/38]
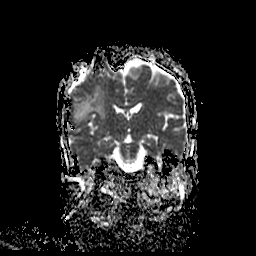
[im 38/38]
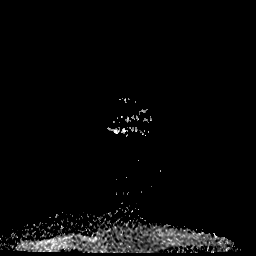

[31 of 48 positions shown; findings below may reference images not displayed]

FINDINGS: Brain: No acute infarct, mass effect or extra-axial collection.
Chronic blood products at the right parietal resection site.
Hyperintense T2-weighted signal anterior to the right section site
is slightly increased. Rounded area of fluid signal is new since
[DATE]. Contrast-enhanced images are markedly degraded by motion
but show mild contrast enhancement surrounding the resection cavity.
The enhancement appears thicker than on the prior study, but
comparison is difficult because of motion.

Vascular: Major flow voids are preserved.

Skull and upper cervical spine: Status post right posterior
craniotomy.

Sinuses/Orbits:No paranasal sinus fluid levels or advanced mucosal
thickening. No mastoid or middle ear effusion. Normal orbits.
IMPRESSION: 1. Motion degraded study, with thickening of the peripheral contrast
enhancement at the resection site and worsened hyperintense
T2-weighted signal anteriorly, concerning for residual/recurrent
tumor.

## 2021-11-04 MED ORDER — ORAL CARE MOUTH RINSE
15.0000 mL | OROMUCOSAL | Status: DC
  Administered 2021-11-04 (×3): 15 mL via OROMUCOSAL

## 2021-11-04 MED ORDER — LORAZEPAM 2 MG/ML IJ SOLN
1.0000 mg | Freq: Once | INTRAMUSCULAR | Status: AC
  Administered 2021-11-04: 1 mg via INTRAVENOUS
  Filled 2021-11-04: qty 1

## 2021-11-04 MED ORDER — ENOXAPARIN SODIUM 40 MG/0.4ML IJ SOSY
40.0000 mg | PREFILLED_SYRINGE | INTRAMUSCULAR | Status: DC
  Administered 2021-11-04 – 2021-11-05 (×2): 40 mg via SUBCUTANEOUS
  Filled 2021-11-04 (×3): qty 0.4

## 2021-11-04 MED ORDER — DOCUSATE SODIUM 50 MG PO CAPS
50.0000 mg | ORAL_CAPSULE | ORAL | Status: DC
  Administered 2021-11-04: 50 mg via ORAL
  Filled 2021-11-04: qty 1

## 2021-11-04 MED ORDER — ACETAMINOPHEN 325 MG PO TABS
650.0000 mg | ORAL_TABLET | Freq: Three times a day (TID) | ORAL | Status: DC | PRN
  Administered 2021-11-04: 650 mg via ORAL
  Filled 2021-11-04: qty 2

## 2021-11-04 MED ORDER — GADOBUTROL 1 MMOL/ML IV SOLN
6.5000 mL | Freq: Once | INTRAVENOUS | Status: AC | PRN
  Administered 2021-11-04: 6.5 mL via INTRAVENOUS

## 2021-11-04 MED ORDER — CHLORHEXIDINE GLUCONATE 0.12% ORAL RINSE (MEDLINE KIT)
15.0000 mL | Freq: Two times a day (BID) | OROMUCOSAL | Status: DC
  Administered 2021-11-04 – 2021-11-05 (×3): 15 mL via OROMUCOSAL

## 2021-11-04 MED ORDER — LEVETIRACETAM IN NACL 1000 MG/100ML IV SOLN
1000.0000 mg | Freq: Two times a day (BID) | INTRAVENOUS | Status: DC
  Administered 2021-11-04 – 2021-11-05 (×3): 1000 mg via INTRAVENOUS
  Filled 2021-11-04 (×4): qty 100

## 2021-11-04 MED ORDER — ONDANSETRON HCL 4 MG PO TABS: 8.0000 mg | ORAL_TABLET | Freq: Two times a day (BID) | ORAL | Status: DC | PRN

## 2021-11-04 MED ORDER — FLEET ENEMA 7-19 GM/118ML RE ENEM
1.0000 | ENEMA | Freq: Once | RECTAL | Status: AC | PRN
  Administered 2021-11-04: 1 via RECTAL
  Filled 2021-11-04: qty 1

## 2021-11-04 MED ORDER — LORAZEPAM 2 MG/ML IJ SOLN
2.0000 mg | INTRAMUSCULAR | 0 refills | Status: DC | PRN
Start: 2021-11-04 — End: 2021-11-05

## 2021-11-04 MED ORDER — OXYCODONE HCL 5 MG PO TABS
5.0000 mg | ORAL_TABLET | ORAL | Status: DC | PRN
  Administered 2021-11-04: 5 mg via ORAL
  Filled 2021-11-04: qty 1

## 2021-11-04 MED ORDER — PANTOPRAZOLE SODIUM 40 MG PO TBEC
40.0000 mg | DELAYED_RELEASE_TABLET | Freq: Every day | ORAL | Status: DC
  Administered 2021-11-04 – 2021-11-05 (×2): 40 mg via ORAL
  Filled 2021-11-04 (×3): qty 1

## 2021-11-04 MED ORDER — VALPROIC ACID 250 MG/5ML PO SOLN
500.0000 mg | Freq: Three times a day (TID) | ORAL | Status: DC
  Administered 2021-11-04 (×2): 500 mg via ORAL
  Filled 2021-11-04 (×2): qty 10

## 2021-11-04 MED ORDER — SODIUM CHLORIDE 0.9 % IV SOLN
75.0000 mL/h | INTRAVENOUS | Status: DC
  Administered 2021-11-04: 75 mL/h via INTRAVENOUS

## 2021-11-04 MED ORDER — VALPROIC ACID 250 MG/5ML PO SOLN: 500.0000 mg | Freq: Three times a day (TID) | ORAL | Status: DC

## 2021-11-04 MED ORDER — LORAZEPAM 2 MG/ML IJ SOLN: 2.0000 mg | INTRAMUSCULAR | Status: DC | PRN

## 2021-11-04 MED ORDER — LEVETIRACETAM IN NACL 1000 MG/100ML IV SOLN: 1000.0000 mg | Freq: Two times a day (BID) | INTRAVENOUS | Status: DC

## 2021-11-04 MED ORDER — LORAZEPAM 2 MG/ML IJ SOLN: 4.0000 mg | INTRAMUSCULAR | Status: DC | PRN

## 2021-11-04 NOTE — ED Notes (Signed)
Pt is asleep at this time.

## 2021-11-04 NOTE — Consult Note (Addendum)
Neurology Consultation ?Reason for Consult: seizure ?Referring Physician: Dr Wynetta Fines ? ?CC: seizure ? ?History is obtained from: patient, chart review ? ?HPI: Nina Johnson is a 71 y.o. female with right parietal glioblastoma status post resection, had first ever seizure in January this year currently on Keppra 500 mg twice daily who presented to [facility] emergency room yesterday after breakthrough seizure at home described as generalized tonic-clonic seizure-like activity lasting for about 2 minutes.  While in the ED she had another seizure lasting about 45 seconds.  CT head without contrast was obtained which showed right parietal glioblastoma with mildly increased regional low density which may reflect edema and/or tumor. No significant mass effect.   ? ?Patient was on Keppra 1000 mg twice daily which was recently reduced to 500 mg twice daily.  Patient also reports missing a few doses of Keppra last week.  At [facility], stat EEG was obtained which showed brief ictal-interictal rhythmic discharges in the right hemisphere therefore, patient was given IV valproic acid 1500 mg and transferred to [facility] for long-term EEG monitoring ? ?ROS: All other systems reviewed and negative except as noted in the HPI.  ? ?Past Medical History:  ?Diagnosis Date  ? Dehiscence of closure of skull or craniotomy 10/04/2021  ? Dehiscence of closure of skull or craniotomy 08/30/2021  ? Glioblastoma ([place])   ? ? ?Family History  ?Problem Relation Age of Onset  ? Cancer Other   ? Seizures Child   ? ? ?Social History:  reports that she has never smoked. She has never used smokeless tobacco. She reports that she does not drink alcohol and does not use drugs. ? ? ?Medications Prior to Admission  ?Medication Sig Dispense Refill Last Dose  ? acetaminophen (TYLENOL) 650 MG CR tablet Take 650 mg by mouth every 8 (eight) hours as needed.     ? DOCUSATE SODIUM PO Take by mouth every other day.     ? levETIRAcetam (KEPPRA) 1000 MG/100ML  SOLN Inject 100 mLs (1,000 mg total) into the vein every 12 (twelve) hours. 2000 mL    ? LORazepam (ATIVAN) 2 MG/ML injection Inject 1 mL (2 mg total) into the vein every 5 (five) minutes x 2 doses as needed for seizure (May repeat dose ONCE in 5 minutes if still actively seizing.). 1 mL 0   ? ondansetron (ZOFRAN) 8 MG tablet Take 1 tablet (8 mg total) by mouth 2 (two) times daily as needed (nausea and vomiting). May take 30-60 minutes prior to Temodar administration if nausea/vomiting occurs. 30 tablet 1   ? ondansetron (ZOFRAN-ODT) 4 MG disintegrating tablet Take 4 mg by mouth every 8 (eight) hours as needed.     ? oxyCODONE (OXY IR/ROXICODONE) 5 MG immediate release tablet Take 5 mg by mouth every 4 (four) hours as needed.     ? pantoprazole (PROTONIX) 40 MG tablet Take 40 mg by mouth daily.     ? temozolomide (TEMODAR) 100 MG capsule Take 1 capsule (100 mg total) by mouth daily. May take on an empty stomach to decrease nausea & vomiting. (Patient not taking: Reported on 11/03/2021) 42 capsule 0   ? temozolomide (TEMODAR) 20 MG capsule Take 1 capsule (20 mg total) by mouth daily. May take on an empty stomach to decrease nausea & vomiting. (Patient not taking: Reported on 11/03/2021) 42 capsule 0   ? valproic acid (DEPAKENE) 250 MG/5ML solution Take 10 mLs (500 mg total) by mouth 3 (three) times daily. 300 mL    ?  ? ? ?  Exam: ?Current vital signs: ?BP 128/71   Pulse 71   Temp 98.2 ?F (36.8 ?C) (Oral)   Resp 17   SpO2 98%  ?Vital signs in last 24 hours: ?Temp:  [97.9 ?F (36.6 ?C)-98.4 ?F (36.9 ?C)] 98.2 ?F (36.8 ?C) (03/13 1147) ?Pulse Rate:  [69-109] 71 (03/13 1147) ?Resp:  [13-22] 17 (03/13 1147) ?BP: (104-144)/(52-79) 128/71 (03/13 1147) ?SpO2:  [94 %-100 %] 98 % (03/13 1147) ?Weight:  [63.8 kg] 63.8 kg (03/12 1540) ? ? ?Physical Exam  ?Constitutional: Appears well-developed and well-nourished.  ?Psych: Affect appropriate to situation ?Eyes: No scleral injection ?HENT: No OP obstrucion ?Head: Normocephalic.   ?Cardiovascular: Normal rate and regular rhythm.  ?Respiratory: Effort normal, non-labored breathing ?GI: Soft.  No distension. There is no tenderness.  ?Skin: Warm ?Neuro: AOx3,no aphasia, left hemineglect, rest CN grossly intact, 5/5 in all extremities, FTN intact BL ? ? ?I have reviewed labs in epic and the results pertinent to this consultation are: ?CBC:  ?Recent Labs  ?Lab 11/03/21 ?1551  ?WBC 6.0  ?NEUTROABS 2.9  ?HGB 7.8*  ?HCT 28.2*  ?MCV 75.0*  ?PLT 469*  ? ? ?Basic Metabolic Panel:  ?Lab Results  ?Component Value Date  ? NA 139 11/03/2021  ? K 3.4 (L) 11/03/2021  ? CO2 25 11/03/2021  ? GLUCOSE 101 (H) 11/03/2021  ? BUN 12 11/03/2021  ? CREATININE 0.73 11/03/2021  ? CALCIUM 8.9 11/03/2021  ? GFRNONAA >60 11/03/2021  ? ?Lipid Panel: No results found for: [place] ?HgbA1c: No results found for: HGBA1C ?Urine Drug Screen: No results found for: LABOPIA, COCAINSCRNUR, [place], [place], THCU, LABBARB  ?Alcohol Level No results found for: ETH ? ? ?I have reviewed the images obtained: ? ?CT head without contrast 11/03/2021: ?1. Right parietal glioblastoma with mildly increased regional low ?density which may reflect edema and/or tumor. No significant mass ?effect. ?2. No evidence of an acute infarct or intracranial hemorrhage. ? ?ASSESSMENT/PLAN: 71 year old female with diagnosis of right frontal glioblastoma status postresection currently on temozolomide and radiation who presented with breakthrough seizures. ? ?Epilepsy with breakthrough seizure ?Glioblastoma status postresection ?-Stat EEG yesterday showed evidence of epileptogenicity arising from right hemisphere likely secondary to underlying craniotomy. Additionlly there are brief ictal-interictal discharges which is on the ictal-interictal continuum with increased risk of seizures ?-Breakthrough seizure in the setting of missing 2 doses of Keppra ? ?Recommendations: ?-Continue Keppra 1000 mg twice daily ?-Continue Depakote 500 mg every 8 hours for now.  If  LTM EEG does not show any further seizures, will consider discontinuing Depakote as it can have significant interactions with chemotherapy ?-If patient has further seizures, would likely add Vimpat ?-We will obtain MRI brain with and without contrast to look for any changes in MRI brain since a month ago as glioblastoma is an aggressive tumor with higher chances of recurrence ?-We will hold off on resuming dexamethasone for now.  However if MRI brain shows significant edema, will likely start dexamethasone again ?-We will obtain video EEG overnight to look for intermittent seizures ?-Continue seizure precautions ?-As needed IV Ativan for clinical seizure-like activity-management of rest of comorbidities per primary team ?- Discussed plan with patient, family on phone and Dr Roosevelt Locks ?- Patient has appt with radiation oncology tomorrow. Will notify Dr Mickeal Skinner ( neuro-oncologist) ? ? ?Thank you for allowing [place] to participate in the care of this patient. If you have any further questions, please contact  me or neurohospitalist.  ? ?Zeb Comfort ?Epilepsy ?[facility] ? ? ? ? ?

## 2021-11-04 NOTE — ED Notes (Signed)
Called [facility] and informed them patient was accepted to [facility] and would be transferred to [facility] and cancel the acceptance of [facility] ?

## 2021-11-04 NOTE — ED Notes (Signed)
PT  GOING  TO  [place]  WILL  SEND  TRUCK  INFORMED  BILL  RN ?

## 2021-11-04 NOTE — H&P (Signed)
History and Physical    Nina Johnson XTK:[id number] DOB: 08/02/1951 DOA: 11/04/2021  PCP: System, Provider Not In (Confirm with patient/family/NH records and if not entered, this has to be entered at [facility] point of entry) Patient coming from: Home  I have personally briefly reviewed patient's old medical records in [place]  Chief Complaint: Feeling tired  HPI: Nina Johnson is a 71 y.o. female with medical history significant of glioblastoma s/p craniotomy January 20 103 at [facility] and revision of dehiscence from first craniotomy in February 2023, new onset seizure December 2022, OA, GERD, chronic microcytic anemia, was brought in to [facility] for concerns of seizure.  Patient does not remember details of the episode, as per family's report at [facility], patient had tonic-clonic seizure at home.  Episode lasted about 2 minutes, no urinary or bowel incontinence.  Patient told me that antiseizure medication dosage has been cut down by her doctor but he could not provide more detailed information at this point.  At [facility] ER, patient was loaded with Keppra 1000 mg, EEG showed evidence of epileptogenicity arising from the right hemisphere likely related to underlying craniotomy.  As per neurology recommendation, patient transferred to [facility] for continuous EEG monitoring.  At the time I visited patient, patient complained about generalized weakness and feeling drowsy, denies any weakness numbness of any of the limbs, no headache no vision changes.  Review of Systems: As per HPI otherwise 14 point review of systems negative.    Past Medical History:  Diagnosis Date   Dehiscence of closure of skull or craniotomy 10/04/2021   Dehiscence of closure of skull or craniotomy 08/30/2021   Glioblastoma ([place])     Past Surgical History:  Procedure Laterality Date   CRANIOTOMY       reports that she has never smoked. She has never used smokeless tobacco. She reports that she does not drink alcohol and does  not use drugs.  No Known Allergies  Family History  Problem Relation Age of Onset   Cancer Other    Seizures Child      Prior to Admission medications   Medication Sig Start Date End Date Taking? Authorizing Provider  acetaminophen (TYLENOL) 650 MG CR tablet Take 650 mg by mouth every 8 (eight) hours as needed.    [provider]  DOCUSATE SODIUM PO Take by mouth every other day.    [provider]  levETIRAcetam (KEPPRA) 1000 MG/100ML SOLN Inject 100 mLs (1,000 mg total) into the vein every 12 (twelve) hours. 11/04/21   Sharen Hones, MD  LORazepam (ATIVAN) 2 MG/ML injection Inject 1 mL (2 mg total) into the vein every 5 (five) minutes x 2 doses as needed for seizure (May repeat dose ONCE in 5 minutes if still actively seizing.). 11/04/21   Sharen Hones, MD  ondansetron (ZOFRAN) 8 MG tablet Take 1 tablet (8 mg total) by mouth 2 (two) times daily as needed (nausea and vomiting). May take 30-60 minutes prior to Temodar administration if nausea/vomiting occurs. 10/25/21   Vaslow, Acey Lav, MD  ondansetron (ZOFRAN-ODT) 4 MG disintegrating tablet Take 4 mg by mouth every 8 (eight) hours as needed.    [provider]  oxyCODONE (OXY IR/ROXICODONE) 5 MG immediate release tablet Take 5 mg by mouth every 4 (four) hours as needed.    [provider]  pantoprazole (PROTONIX) 40 MG tablet Take 40 mg by mouth daily. 10/06/21 10/06/22  [provider]  temozolomide (TEMODAR) 100 MG capsule Take 1 capsule (100 mg  total) by mouth daily. May take on an empty stomach to decrease nausea & vomiting. Patient not taking: Reported on 11/03/2021 10/25/21   Ventura Sellers, MD  temozolomide (TEMODAR) 20 MG capsule Take 1 capsule (20 mg total) by mouth daily. May take on an empty stomach to decrease nausea & vomiting. Patient not taking: Reported on 11/03/2021 10/25/21   Ventura Sellers, MD  valproic acid (DEPAKENE) 250 MG/5ML solution Take 10 mLs (500 mg total) by mouth 3  (three) times daily. 11/04/21   Sharen Hones, MD    Physical Exam: Vitals:   11/04/21 0941 11/04/21 1147  BP: 121/68 128/71  Pulse: 83 71  Resp: 18 17  Temp: 98.2 F (36.8 C) 98.2 F (36.8 C)  TempSrc: Oral Oral  SpO2: 97% 98%    Constitutional: NAD, calm, comfortable Vitals:   11/04/21 0941 11/04/21 1147  BP: 121/68 128/71  Pulse: 83 71  Resp: 18 17  Temp: 98.2 F (36.8 C) 98.2 F (36.8 C)  TempSrc: Oral Oral  SpO2: 97% 98%   Eyes: PERRL, lids and conjunctivae normal ENMT: Mucous membranes are moist. Posterior pharynx clear of any exudate or lesions.Normal dentition.  Neck: normal, supple, no masses, no thyromegaly Respiratory: clear to auscultation bilaterally, no wheezing, no crackles. Normal respiratory effort. No accessory muscle use.  Cardiovascular: Regular rate and rhythm, no murmurs / rubs / gallops. No extremity edema. 2+ pedal pulses. No carotid bruits.  Abdomen: no tenderness, no masses palpated. No hepatosplenomegaly. Bowel sounds positive.  Musculoskeletal: no clubbing / cyanosis. No joint deformity upper and lower extremities. Good ROM, no contractures. Normal muscle tone.  Skin: no rashes, lesions, ulcers. No induration Neurologic: CN 2-12 grossly intact. Sensation intact, DTR normal. Strength 5/5 in all 4.  Psychiatric: Normal judgment and insight. Alert and oriented x 3. Normal mood.     Labs on Admission: I have personally reviewed following labs and imaging studies  CBC: Recent Labs  Lab 11/03/21 1551  WBC 6.0  NEUTROABS 2.9  HGB 7.8*  HCT 28.2*  MCV 75.0*  PLT 353*   Basic Metabolic Panel: Recent Labs  Lab 11/03/21 1551 11/04/21 0441  NA 139  --   K 3.4*  --   CL 107  --   CO2 25  --   GLUCOSE 101*  --   BUN 12  --   CREATININE 0.73  --   CALCIUM 8.9  --   MG 2.3 2.3   GFR: Estimated Creatinine Clearance: 56.6 mL/min (by C-G formula based on SCr of 0.73 mg/dL). Liver Function Tests: Recent Labs  Lab 11/03/21 1551  AST 38   ALT 32  ALKPHOS 71  BILITOT 0.5  PROT 6.5  ALBUMIN 3.5   No results for input(s): LIPASE, AMYLASE in the last 168 hours. No results for input(s): AMMONIA in the last 168 hours. Coagulation Profile: No results for input(s): INR, PROTIME in the last 168 hours. Cardiac Enzymes: No results for input(s): CKTOTAL, CKMB, CKMBINDEX, TROPONINI in the last 168 hours. BNP (last 3 results) No results for input(s): PROBNP in the last 8760 hours. HbA1C: No results for input(s): HGBA1C in the last 72 hours. CBG: No results for input(s): GLUCAP in the last 168 hours. Lipid Profile: No results for input(s): CHOL, HDL, LDLCALC, TRIG, CHOLHDL, LDLDIRECT in the last 72 hours. Thyroid Function Tests: No results for input(s): TSH, T4TOTAL, FREET4, T3FREE, THYROIDAB in the last 72 hours. Anemia Panel: Recent Labs    11/04/21 0441  VITAMINB12 216  FOLATE >  23.5  FERRITIN 8*  TIBC 400  IRON 22*  RETICCTPCT 1.8   Urine analysis:    Component Value Date/Time   COLORURINE STRAW (A) 11/03/2021 1551   APPEARANCEUR CLEAR (A) 11/03/2021 1551   LABSPEC 1.009 11/03/2021 1551   PHURINE 8.0 11/03/2021 1551   GLUCOSEU NEGATIVE 11/03/2021 1551   HGBUR NEGATIVE 11/03/2021 1551   BILIRUBINUR NEGATIVE 11/03/2021 1551   KETONESUR NEGATIVE 11/03/2021 1551   PROTEINUR NEGATIVE 11/03/2021 1551   NITRITE NEGATIVE 11/03/2021 1551   LEUKOCYTESUR NEGATIVE 11/03/2021 1551    Radiological Exams on Admission: CT HEAD WO CONTRAST (5MM)  Result Date: 11/03/2021 CLINICAL DATA:  Head trauma.  Seizure.  History of glioblastoma. EXAM: CT HEAD WITHOUT CONTRAST CT CERVICAL SPINE WITHOUT CONTRAST TECHNIQUE: Multidetector CT imaging of the head and cervical spine was performed following the standard protocol without intravenous contrast. Multiplanar CT image reconstructions of the cervical spine were also generated. RADIATION DOSE REDUCTION: This exam was performed according to the departmental dose-optimization program  which includes automated exposure control, adjustment of the mA and/or kV according to patient size and/or use of iterative reconstruction technique. COMPARISON:  10/05/2021 head MRI from [facility]. 08/30/2021 head CT from [facility]. FINDINGS: CT HEAD FINDINGS Brain: Sequelae of right parietal tumor resection are again identified. A cystic area in the right parietal lobe appears larger than on the prior MRI and may reflect a resection cavity or cystic/necrotic tumor. Additional low-density in the surrounding white matter suggestive of edema has mildly increased. There is no midline shift or other significant mass effect. Trace extra-axial fluid is noted subjacent to the craniotomy. No acute cortically based infarct or acute intracranial hemorrhage is identified. The lateral and third ventricles are normal in size. There is ex vacuo dilatation of the fourth ventricle related to prominent inferior cerebellar atrophy. Vascular: Calcified atherosclerosis at the skull base. No hyperdense vessel. Skull: Right parietal craniotomy. Sinuses/Orbits: Visualized paranasal sinuses and mastoid air cells are clear. Unremarkable orbits. Other: None. CT CERVICAL SPINE FINDINGS Alignment: Straightening of the normal cervical lordosis. Trace anterolisthesis of C6 on C7. Left convex curvature of the cervical spine. Skull base and vertebrae: No acute fracture or suspicious osseous lesion. Soft tissues and spinal canal: No prevertebral fluid or swelling. No visible canal hematoma. Disc levels: Focally advanced disc degeneration at C5-6 where there is severe disc space narrowing and uncovertebral spurring with mild bilateral neural foraminal stenosis. Upper chest: Nonspecific mosaic attenuation in the lung apices. Other: Subcentimeter bilateral thyroid nodules for which no follow-up imaging is recommended. IMPRESSION: 1. Right parietal glioblastoma with mildly increased regional low density which may reflect edema and/or  tumor. No significant mass effect. 2. No evidence of an acute infarct or intracranial hemorrhage. 3. No acute cervical spine fracture. Electronically Signed   By: Logan Bores M.D.   On: 11/03/2021 16:32   CT Cervical Spine Wo Contrast  Result Date: 11/03/2021 CLINICAL DATA:  Head trauma.  Seizure.  History of glioblastoma. EXAM: CT HEAD WITHOUT CONTRAST CT CERVICAL SPINE WITHOUT CONTRAST TECHNIQUE: Multidetector CT imaging of the head and cervical spine was performed following the standard protocol without intravenous contrast. Multiplanar CT image reconstructions of the cervical spine were also generated. RADIATION DOSE REDUCTION: This exam was performed according to the departmental dose-optimization program which includes automated exposure control, adjustment of the mA and/or kV according to patient size and/or use of iterative reconstruction technique. COMPARISON:  10/05/2021 head MRI from [facility]. 08/30/2021 head CT from [facility]. FINDINGS: CT  HEAD FINDINGS Brain: Sequelae of right parietal tumor resection are again identified. A cystic area in the right parietal lobe appears larger than on the prior MRI and may reflect a resection cavity or cystic/necrotic tumor. Additional low-density in the surrounding white matter suggestive of edema has mildly increased. There is no midline shift or other significant mass effect. Trace extra-axial fluid is noted subjacent to the craniotomy. No acute cortically based infarct or acute intracranial hemorrhage is identified. The lateral and third ventricles are normal in size. There is ex vacuo dilatation of the fourth ventricle related to prominent inferior cerebellar atrophy. Vascular: Calcified atherosclerosis at the skull base. No hyperdense vessel. Skull: Right parietal craniotomy. Sinuses/Orbits: Visualized paranasal sinuses and mastoid air cells are clear. Unremarkable orbits. Other: None. CT CERVICAL SPINE FINDINGS Alignment: Straightening of the  normal cervical lordosis. Trace anterolisthesis of C6 on C7. Left convex curvature of the cervical spine. Skull base and vertebrae: No acute fracture or suspicious osseous lesion. Soft tissues and spinal canal: No prevertebral fluid or swelling. No visible canal hematoma. Disc levels: Focally advanced disc degeneration at C5-6 where there is severe disc space narrowing and uncovertebral spurring with mild bilateral neural foraminal stenosis. Upper chest: Nonspecific mosaic attenuation in the lung apices. Other: Subcentimeter bilateral thyroid nodules for which no follow-up imaging is recommended. IMPRESSION: 1. Right parietal glioblastoma with mildly increased regional low density which may reflect edema and/or tumor. No significant mass effect. 2. No evidence of an acute infarct or intracranial hemorrhage. 3. No acute cervical spine fracture. Electronically Signed   By: Logan Bores M.D.   On: 11/03/2021 16:32   EEG adult  Result Date: 11/03/2021 Lora Havens, MD     11/03/2021  9:22 PM Patient Name: Nina Johnson MRN: [id number] Epilepsy Attending: Lora Havens Referring Physician/Provider: Kerney Elbe, MD Date: 11/03/2021 Duration: 21.21 mins Patient history: 71yo F with h/o brain tumor s/p resection, seizure who presented with seizure. EEG to evaluate for seizure Level of alertness: Awake/ lethargic AEDs during EEG study: LEV, VPA Technical aspects: This EEG study was done with scalp electrodes positioned according to the 10-20 International system of electrode placement. Electrical activity was acquired at a sampling rate of '500Hz'$  and reviewed with a high frequency filter of '70Hz'$  and a low frequency filter of '1Hz'$ . EEG data were recorded continuously and digitally stored. Description: The posterior dominant rhythm consists of 8-9 Hz activity of moderate voltage (25-35 uV) seen predominantly in posterior head regions, symmetric and reactive to eye opening and eye closing. EEG also showed lateralized  periodic discharges with overriding fast activity at 1-2.5 Hz in right hemisphere at times rhythmic with evolution in morphology, lasting 2-10 seconds. This EEG pattern is consistent with brief ictal-interictal rhythmic discharges ( BIRDs). Hyperventilation and photic stimulation were not performed.   ABNORMALITY - Brief ictal-interictal rhythmic discharges, right hemisphere IMPRESSION: This study showed evidence of epileptogenicity arising from right hemisphere likely secondary to underlying craniotomy. Additionlly there are brief ictal-interictal discharges which is on the ictal-interictal continuum with increased risk of seizures. Recommend IV ativan stat and prolonged EEG monitoring. Dr Cheral Marker was notified.  Priyanka Barbra Sarks    EKG: Independently reviewed.  Sinus rhythm, no acute ST changes.  Assessment/Plan Principal Problem:   Seizure [facility]) Active Problems:   Epilepsy ([place])  (please populate well all problems here in Problem List. (For example, if patient is on BP meds at home and you resume or decide to hold them, it is a problem that needs to  be her. Same for CAD, COPD, HLD and so on)  Seizure, breakthrough versus noncoherent with seizure medication -As per neurology, continue Keppra 1000 mg IV twice daily, and Depakote 500 mg 3 times daily -Ordered continuous EEG monitoring x24 hours, neurology consult to follow -As needed Ativan, seizure precaution -Admitted to PCU for close monitoring.  Chronic microcytic anemia -Iron level borderline low, recommend outpatient GI work-up for colonoscopy.  GERD -Continue Keppra  History of glioblastoma status post craniotomy and excision -Follow-up with neurosurgery at [facility].  DVT prophylaxis: Lovenox Code Status: Full code Family Communication: None at bedside Disposition Plan: Expect less than 2 midnight hospital stay Consults called: Neurology Admission status: PCU   Lequita Halt MD [facility] Pager [phone]  11/04/2021, 1:21 PM

## 2021-11-04 NOTE — Progress Notes (Signed)
Patient is not available at the moment, has requested for tech to return later. ?

## 2021-11-04 NOTE — Discharge Summary (Signed)
Physician Discharge Summary   Patient: Nina Johnson MRN: [id number] DOB: 1950/10/22  Admit date:     11/03/2021  Discharge date: 11/04/21  Discharge Physician: Sharen Hones   PCP: System, Provider Not In   Recommendations at discharge:      Discharge Diagnoses: Principal Problem:   Seizure [facility]) Active Problems:   Epilepsy ([place])   Glioblastoma ([place])   GERD (gastroesophageal reflux disease)   OA (osteoarthritis)   Tongue biting  Resolved Problems:   * No resolved hospital problems. [facility] Hospital Course: Ms. Nina Johnson is a 71 year old female with history of GERD, microcytic anemia, glioblastoma status post craniotomy in January 2023 and revision of dehiscence from craniotomy in February 2023, new onset seizure in December 2022, osteoarthritis, who presents emergency department from home for chief concerns of seizures. Patient is treated with IV Keppra and Depakote.  EEG showed seizure activity. Neurology recommend continuing his EEG monitoring.  Patient will be transferred to [facility].         Consultants: Neurology Procedures performed: None  Disposition:  [facility] hospital Diet recommendation:  Discharge Diet Orders (From admission, onward)     Start     Ordered   11/04/21 0000  Diet - low sodium heart healthy        11/04/21 0810           Cardiac diet DISCHARGE MEDICATION: Allergies as of 11/04/2021   No Known Allergies      Medication List     STOP taking these medications    levETIRAcetam 500 MG tablet Commonly known as: KEPPRA       TAKE these medications    acetaminophen 650 MG CR tablet Commonly known as: TYLENOL Take 650 mg by mouth every 8 (eight) hours as needed.   DOCUSATE SODIUM PO Take by mouth every other day.   levETIRAcetam 1000 MG/100ML Soln Commonly known as: KEPPRA Inject 100 mLs (1,000 mg total) into the vein every 12 (twelve) hours.   LORazepam 2 MG/ML injection Commonly known as: ATIVAN Inject 1 mL (2 mg  total) into the vein every 5 (five) minutes x 2 doses as needed for seizure (May repeat dose ONCE in 5 minutes if still actively seizing.).   ondansetron 4 MG disintegrating tablet Commonly known as: ZOFRAN-ODT Take 4 mg by mouth every 8 (eight) hours as needed.   ondansetron 8 MG tablet Commonly known as: Zofran Take 1 tablet (8 mg total) by mouth 2 (two) times daily as needed (nausea and vomiting). May take 30-60 minutes prior to Temodar administration if nausea/vomiting occurs.   oxyCODONE 5 MG immediate release tablet Commonly known as: Oxy IR/ROXICODONE Take 5 mg by mouth every 4 (four) hours as needed.   pantoprazole 40 MG tablet Commonly known as: PROTONIX Take 40 mg by mouth daily.   temozolomide 20 MG capsule Commonly known as: TEMODAR Take 1 capsule (20 mg total) by mouth daily. May take on an empty stomach to decrease nausea & vomiting.   temozolomide 100 MG capsule Commonly known as: TEMODAR Take 1 capsule (100 mg total) by mouth daily. May take on an empty stomach to decrease nausea & vomiting.   valproic acid 250 MG/5ML solution Commonly known as: DEPAKENE Take 10 mLs (500 mg total) by mouth 3 (three) times daily.        Discharge Exam: Filed Weights   11/03/21 1540  Weight: 63.8 kg   General exam: Appears calm and comfortable  Respiratory system: Clear to auscultation. Respiratory effort normal.  Cardiovascular system: S1 & S2 heard, RRR. No JVD, murmurs, rubs, gallops or clicks. No pedal edema. Gastrointestinal system: Abdomen is nondistended, soft and nontender. No organomegaly or masses felt. Normal bowel sounds heard. Central nervous system: Alert and oriented. No focal neurological deficits. Extremities: Symmetric 5 x 5 power. Skin: No rashes, lesions or ulcers Psychiatry: Judgement and insight appear normal. Mood & affect appropriate.    Condition at discharge: good  The results of significant diagnostics from this hospitalization (including  imaging, microbiology, ancillary and laboratory) are listed below for reference.   Imaging Studies: CT HEAD WO CONTRAST (5MM)  Result Date: 11/03/2021 CLINICAL DATA:  Head trauma.  Seizure.  History of glioblastoma. EXAM: CT HEAD WITHOUT CONTRAST CT CERVICAL SPINE WITHOUT CONTRAST TECHNIQUE: Multidetector CT imaging of the head and cervical spine was performed following the standard protocol without intravenous contrast. Multiplanar CT image reconstructions of the cervical spine were also generated. RADIATION DOSE REDUCTION: This exam was performed according to the departmental dose-optimization program which includes automated exposure control, adjustment of the mA and/or kV according to patient size and/or use of iterative reconstruction technique. COMPARISON:  10/05/2021 head MRI from [facility]. 08/30/2021 head CT from [facility]. FINDINGS: CT HEAD FINDINGS Brain: Sequelae of right parietal tumor resection are again identified. A cystic area in the right parietal lobe appears larger than on the prior MRI and may reflect a resection cavity or cystic/necrotic tumor. Additional low-density in the surrounding white matter suggestive of edema has mildly increased. There is no midline shift or other significant mass effect. Trace extra-axial fluid is noted subjacent to the craniotomy. No acute cortically based infarct or acute intracranial hemorrhage is identified. The lateral and third ventricles are normal in size. There is ex vacuo dilatation of the fourth ventricle related to prominent inferior cerebellar atrophy. Vascular: Calcified atherosclerosis at the skull base. No hyperdense vessel. Skull: Right parietal craniotomy. Sinuses/Orbits: Visualized paranasal sinuses and mastoid air cells are clear. Unremarkable orbits. Other: None. CT CERVICAL SPINE FINDINGS Alignment: Straightening of the normal cervical lordosis. Trace anterolisthesis of C6 on C7. Left convex curvature of the cervical spine.  Skull base and vertebrae: No acute fracture or suspicious osseous lesion. Soft tissues and spinal canal: No prevertebral fluid or swelling. No visible canal hematoma. Disc levels: Focally advanced disc degeneration at C5-6 where there is severe disc space narrowing and uncovertebral spurring with mild bilateral neural foraminal stenosis. Upper chest: Nonspecific mosaic attenuation in the lung apices. Other: Subcentimeter bilateral thyroid nodules for which no follow-up imaging is recommended. IMPRESSION: 1. Right parietal glioblastoma with mildly increased regional low density which may reflect edema and/or tumor. No significant mass effect. 2. No evidence of an acute infarct or intracranial hemorrhage. 3. No acute cervical spine fracture. Electronically Signed   By: Logan Bores M.D.   On: 11/03/2021 16:32   CT Cervical Spine Wo Contrast  Result Date: 11/03/2021 CLINICAL DATA:  Head trauma.  Seizure.  History of glioblastoma. EXAM: CT HEAD WITHOUT CONTRAST CT CERVICAL SPINE WITHOUT CONTRAST TECHNIQUE: Multidetector CT imaging of the head and cervical spine was performed following the standard protocol without intravenous contrast. Multiplanar CT image reconstructions of the cervical spine were also generated. RADIATION DOSE REDUCTION: This exam was performed according to the departmental dose-optimization program which includes automated exposure control, adjustment of the mA and/or kV according to patient size and/or use of iterative reconstruction technique. COMPARISON:  10/05/2021 head MRI from [facility]. 08/30/2021 head CT from [facility]. FINDINGS: CT HEAD  FINDINGS Brain: Sequelae of right parietal tumor resection are again identified. A cystic area in the right parietal lobe appears larger than on the prior MRI and may reflect a resection cavity or cystic/necrotic tumor. Additional low-density in the surrounding white matter suggestive of edema has mildly increased. There is no midline shift  or other significant mass effect. Trace extra-axial fluid is noted subjacent to the craniotomy. No acute cortically based infarct or acute intracranial hemorrhage is identified. The lateral and third ventricles are normal in size. There is ex vacuo dilatation of the fourth ventricle related to prominent inferior cerebellar atrophy. Vascular: Calcified atherosclerosis at the skull base. No hyperdense vessel. Skull: Right parietal craniotomy. Sinuses/Orbits: Visualized paranasal sinuses and mastoid air cells are clear. Unremarkable orbits. Other: None. CT CERVICAL SPINE FINDINGS Alignment: Straightening of the normal cervical lordosis. Trace anterolisthesis of C6 on C7. Left convex curvature of the cervical spine. Skull base and vertebrae: No acute fracture or suspicious osseous lesion. Soft tissues and spinal canal: No prevertebral fluid or swelling. No visible canal hematoma. Disc levels: Focally advanced disc degeneration at C5-6 where there is severe disc space narrowing and uncovertebral spurring with mild bilateral neural foraminal stenosis. Upper chest: Nonspecific mosaic attenuation in the lung apices. Other: Subcentimeter bilateral thyroid nodules for which no follow-up imaging is recommended. IMPRESSION: 1. Right parietal glioblastoma with mildly increased regional low density which may reflect edema and/or tumor. No significant mass effect. 2. No evidence of an acute infarct or intracranial hemorrhage. 3. No acute cervical spine fracture. Electronically Signed   By: Logan Bores M.D.   On: 11/03/2021 16:32   EEG adult  Result Date: 11/03/2021 Nina Havens, MD     11/03/2021  9:22 PM Patient Name: Nina Johnson MRN: [id number] Epilepsy Attending: Lora Johnson Referring Physician/Provider: Kerney Elbe, MD Date: 11/03/2021 Duration: 21.21 mins Patient history: 71yo F with h/o brain tumor s/p resection, seizure who presented with seizure. EEG to evaluate for seizure Level of alertness: Awake/  lethargic AEDs during EEG study: LEV, VPA Technical aspects: This EEG study was done with scalp electrodes positioned according to the 10-20 International system of electrode placement. Electrical activity was acquired at a sampling rate of '500Hz'$  and reviewed with a high frequency filter of '70Hz'$  and a low frequency filter of '1Hz'$ . EEG data were recorded continuously and digitally stored. Description: The posterior dominant rhythm consists of 8-9 Hz activity of moderate voltage (25-35 uV) seen predominantly in posterior head regions, symmetric and reactive to eye opening and eye closing. EEG also showed lateralized periodic discharges with overriding fast activity at 1-2.5 Hz in right hemisphere at times rhythmic with evolution in morphology, lasting 2-10 seconds. This EEG pattern is consistent with brief ictal-interictal rhythmic discharges ( BIRDs). Hyperventilation and photic stimulation were not performed.   ABNORMALITY - Brief ictal-interictal rhythmic discharges, right hemisphere IMPRESSION: This study showed evidence of epileptogenicity arising from right hemisphere likely secondary to underlying craniotomy. Additionlly there are brief ictal-interictal discharges which is on the ictal-interictal continuum with increased risk of seizures. Recommend IV ativan stat and prolonged EEG monitoring. Dr Cheral Marker was notified.  Nina Johnson   CT OUTSIDE FILMS HEAD/FACE  Result Date: 11/01/2021 This examination belongs to an outside facility and is stored here for comparison purposes only.  Contact the originating outside institution for any associated report or interpretation.  CT OUTSIDE FILMS HEAD/FACE  Result Date: 11/01/2021 This examination belongs to an outside facility and is stored here for comparison purposes only.  Contact  the originating outside institution for any associated report or interpretation.  MR OUTSIDE FILMS HEAD/FACE  Result Date: 11/01/2021 This examination belongs to an outside  facility and is stored here for comparison purposes only.  Contact the originating outside institution for any associated report or interpretation.   Microbiology: Results for orders placed or performed during the hospital encounter of 11/03/21  Resp Panel by RT-PCR (Flu A&B, Covid) Nasopharyngeal Swab     Status: None   Collection Time: 11/03/21  6:27 PM   Specimen: Nasopharyngeal Swab; Nasopharyngeal(NP) swabs in vial transport medium  Result Value Ref Range Status   SARS Coronavirus 2 by RT PCR NEGATIVE NEGATIVE Final    Comment: (NOTE) SARS-CoV-2 target nucleic acids are NOT DETECTED.  The SARS-CoV-2 RNA is generally detectable in upper respiratory specimens during the acute phase of infection. The lowest concentration of SARS-CoV-2 viral copies this assay can detect is 138 copies/mL. A negative result does not preclude SARS-Cov-2 infection and should not be used as the sole basis for treatment or other patient management decisions. A negative result may occur with  improper specimen collection/handling, submission of specimen other than nasopharyngeal swab, presence of viral mutation(s) within the areas targeted by this assay, and inadequate number of viral copies(<138 copies/mL). A negative result must be combined with clinical observations, patient history, and epidemiological information. The expected result is Negative.  Fact Sheet for Patients:  EntrepreneurPulse.com.au  Fact Sheet for Healthcare Providers:  IncredibleEmployment.be  This test is no t yet approved or cleared by the [place] FDA and  has been authorized for detection and/or diagnosis of SARS-CoV-2 by FDA under an Emergency Use Authorization (EUA). This EUA will remain  in effect (meaning this test can be used) for the duration of the COVID-19 declaration under Section 564(b)(1) of the Act, 21 U.S.C.section 360bbb-3(b)(1), unless the authorization is terminated  or  revoked sooner.       Influenza A by PCR NEGATIVE NEGATIVE Final   Influenza B by PCR NEGATIVE NEGATIVE Final    Comment: (NOTE) The Xpert Xpress SARS-CoV-2/FLU/RSV plus assay is intended as an aid in the diagnosis of influenza from Nasopharyngeal swab specimens and should not be used as a sole basis for treatment. Nasal washings and aspirates are unacceptable for Xpert Xpress SARS-CoV-2/FLU/RSV testing.  Fact Sheet for Patients: EntrepreneurPulse.com.au  Fact Sheet for Healthcare Providers: IncredibleEmployment.be  This test is not yet approved or cleared by the [place] FDA and has been authorized for detection and/or diagnosis of SARS-CoV-2 by FDA under an Emergency Use Authorization (EUA). This EUA will remain in effect (meaning this test can be used) for the duration of the COVID-19 declaration under Section 564(b)(1) of the Act, 21 U.S.C. section 360bbb-3(b)(1), unless the authorization is terminated or revoked.  Performed at [facility], [address] [place], [place] [address]     Labs: CBC: Recent Labs  Lab 11/03/21 1551  WBC 6.0  NEUTROABS 2.9  HGB 7.8*  HCT 28.2*  MCV 75.0*  PLT 191*   Basic Metabolic Panel: Recent Labs  Lab 11/03/21 1551 11/04/21 0441  NA 139  --   K 3.4*  --   CL 107  --   CO2 25  --   GLUCOSE 101*  --   BUN 12  --   CREATININE 0.73  --   CALCIUM 8.9  --   MG 2.3 2.3   Liver Function Tests: Recent Labs  Lab 11/03/21 1551  AST 38  ALT 32  ALKPHOS  71  BILITOT 0.5  PROT 6.5  ALBUMIN 3.5   CBG: No results for input(s): GLUCAP in the last 168 hours.  Discharge time spent: more than 30 minutes.  Signed: Sharen Hones, MD [facility] 11/04/2021

## 2021-11-05 ENCOUNTER — Other Ambulatory Visit: Payer: Self-pay

## 2021-11-05 ENCOUNTER — Ambulatory Visit: Payer: Medicare Other

## 2021-11-05 ENCOUNTER — Inpatient Hospital Stay: Payer: Medicare Other | Admitting: Pharmacist

## 2021-11-05 ENCOUNTER — Other Ambulatory Visit (HOSPITAL_COMMUNITY): Payer: Self-pay

## 2021-11-05 ENCOUNTER — Encounter: Payer: Self-pay | Admitting: Internal Medicine

## 2021-11-05 DIAGNOSIS — R569 Unspecified convulsions: Secondary | ICD-10-CM | POA: Diagnosis not present

## 2021-11-05 DIAGNOSIS — C719 Malignant neoplasm of brain, unspecified: Secondary | ICD-10-CM

## 2021-11-05 LAB — CBC WITH DIFFERENTIAL/PLATELET
Abs Immature Granulocytes: 0.05 10*3/uL (ref 0.00–0.07)
Basophils Absolute: 0 10*3/uL (ref 0.0–0.1)
Basophils Relative: 0 %
Eosinophils Absolute: 0 10*3/uL (ref 0.0–0.5)
Eosinophils Relative: 0 %
HCT: 27.2 % — ABNORMAL LOW (ref 36.0–46.0)
Hemoglobin: 7.9 g/dL — ABNORMAL LOW (ref 12.0–15.0)
Immature Granulocytes: 0 %
Lymphocytes Relative: 13 %
Lymphs Abs: 1.6 10*3/uL (ref 0.7–4.0)
MCH: 21.3 pg — ABNORMAL LOW (ref 26.0–34.0)
MCHC: 29 g/dL — ABNORMAL LOW (ref 30.0–36.0)
MCV: 73.3 fL — ABNORMAL LOW (ref 80.0–100.0)
Monocytes Absolute: 0.6 10*3/uL (ref 0.1–1.0)
Monocytes Relative: 5 %
Neutro Abs: 10.5 10*3/uL — ABNORMAL HIGH (ref 1.7–7.7)
Neutrophils Relative %: 82 %
Platelets: 473 10*3/uL — ABNORMAL HIGH (ref 150–400)
RBC: 3.71 MIL/uL — ABNORMAL LOW (ref 3.87–5.11)
RDW: 17.2 % — ABNORMAL HIGH (ref 11.5–15.5)
WBC: 12.8 10*3/uL — ABNORMAL HIGH (ref 4.0–10.5)
nRBC: 0.2 % (ref 0.0–0.2)

## 2021-11-05 LAB — BASIC METABOLIC PANEL
Anion gap: 10 (ref 5–15)
BUN: 8 mg/dL (ref 8–23)
CO2: 21 mmol/L — ABNORMAL LOW (ref 22–32)
Calcium: 8.9 mg/dL (ref 8.9–10.3)
Chloride: 107 mmol/L (ref 98–111)
Creatinine, Ser: 0.54 mg/dL (ref 0.44–1.00)
GFR, Estimated: >60 mL/min (ref >60)
Glucose, Bld: 123 mg/dL — ABNORMAL HIGH (ref 70–99)
Potassium: 4.2 mmol/L (ref 3.5–5.1)
Sodium: 138 mmol/L (ref 135–145)

## 2021-11-05 LAB — CBC
HCT: 28 % — ABNORMAL LOW (ref 36.0–46.0)
Hemoglobin: 8.3 g/dL — ABNORMAL LOW (ref 12.0–15.0)
MCH: 21.7 pg — ABNORMAL LOW (ref 26.0–34.0)
MCHC: 29.6 g/dL — ABNORMAL LOW (ref 30.0–36.0)
MCV: 73.1 fL — ABNORMAL LOW (ref 80.0–100.0)
Platelets: 473 10*3/uL — ABNORMAL HIGH (ref 150–400)
RBC: 3.83 MIL/uL — ABNORMAL LOW (ref 3.87–5.11)
RDW: 17.2 % — ABNORMAL HIGH (ref 11.5–15.5)
WBC: 14.3 10*3/uL — ABNORMAL HIGH (ref 4.0–10.5)
nRBC: 0 % (ref 0.0–0.2)

## 2021-11-05 LAB — PROCALCITONIN: Procalcitonin: 0.18 ng/mL

## 2021-11-05 LAB — LEVETIRACETAM LEVEL: Levetiracetam Lvl: 5.8 ug/mL — ABNORMAL LOW (ref 10.0–40.0)

## 2021-11-05 MED ORDER — POLY-IRON 150 FORTE 150-25-1 MG-MCG-MG PO CAPS
150.0000 mg | ORAL_CAPSULE | Freq: Every day | ORAL | 0 refills | Status: DC
  Filled 2021-11-05: qty 30, 30d supply, fill #0

## 2021-11-05 MED ORDER — SODIUM CHLORIDE 0.9 % IV BOLUS
1000.0000 mL | Freq: Once | INTRAVENOUS | Status: AC
  Administered 2021-11-05: 1000 mL via INTRAVENOUS

## 2021-11-05 MED ORDER — LEVETIRACETAM 1000 MG PO TABS
1000.0000 mg | ORAL_TABLET | Freq: Two times a day (BID) | ORAL | 0 refills | Status: DC
  Filled 2021-11-05: qty 60, 30d supply, fill #0

## 2021-11-05 MED ORDER — POLYSACCHARIDE IRON COMPLEX 150 MG PO CAPS
150.0000 mg | ORAL_CAPSULE | Freq: Every day | ORAL | Status: DC
  Administered 2021-11-05: 150 mg via ORAL
  Filled 2021-11-05: qty 1

## 2021-11-05 NOTE — Progress Notes (Signed)
LTM EEG discontinued - no skin breakdown at unhook.   

## 2021-11-05 NOTE — Progress Notes (Signed)
Subjective: No seizures overnight ? ?ROS: negative except above ? ?Examination ? ?Vital signs in last 24 hours: ?Temp:  [97.8 ?F (36.6 ?C)-98.2 ?F (36.8 ?C)] 97.8 ?F (36.6 ?C) (03/13 2310) ?Pulse Rate:  [71-102] 75 (03/13 2310) ?Resp:  [16-18] 16 (03/13 2310) ?BP: (109-128)/(55-71) 109/55 (03/13 2310) ?SpO2:  [90 %-98 %] 93 % (03/14 0512) ? ?General: lying in bed, NAD ?Neuro:AOx3,no aphasia, left hemineglect, rest CN grossly intact, 5/5 in all extremities, FTN intact BL ? ?Gait: not tested ? ?Basic Metabolic Panel: ?Recent Labs  ?Lab 11/03/21 ?1551 11/04/21 ?0441 11/05/21 ?0434  ?NA 139  --  138  ?K 3.4*  --  4.2  ?CL 107  --  107  ?CO2 25  --  21*  ?GLUCOSE 101*  --  123*  ?BUN 12  --  8  ?CREATININE 0.73  --  0.54  ?CALCIUM 8.9  --  8.9  ?MG 2.3 2.3  --   ? ? ?CBC: ?Recent Labs  ?Lab 11/03/21 ?1551 11/05/21 ?0434  ?WBC 6.0 14.3*  ?NEUTROABS 2.9  --   ?HGB 7.8* 8.3*  ?HCT 28.2* 28.0*  ?MCV 75.0* 73.1*  ?PLT 469* 473*  ? ? ? ?Coagulation Studies: ?No results for input(s): LABPROT, INR in the last 72 hours. ? ?Imaging ?MRI brain with and without contrast 11/04/2021:Motion degraded study, with thickening of the peripheral contrast enhancement at the resection site and worsened hyperintense T2-weighted signal anteriorly, concerning for residual/recurrent tumor. ? ?ASSESSMENT AND PLAN: 71 year old female with diagnosis of right frontal glioblastoma status postresection currently on temozolomide and radiation who presented with breakthrough seizures. ?  ?Epilepsy with breakthrough seizure ?Glioblastoma status postresection ?-Breakthrough seizure in the setting of missing 2 doses of Keppra ?  ?Recommendations: ?-Continue Keppra 1000 mg twice daily ?-Discontinue Depakote. If patient has further seizures, would likely add Vimpat ?-DC video EEG as no seizures overnight ?-Patient has follow-up appointment with radiation oncology and neuro-oncology today.  I have notified Dr. Mickeal Skinner about the MRI.  Will defer further management  to Dr. Mickeal Skinner ?-Continue seizure precautions ?-Called and notified patient's cousin Tye [place]. ? ?Seizure precautions: ?Per [facility] statutes, patients with seizures are not allowed to drive until they have been seizure-free for six months and cleared by a physician  ?  ?Use caution when using heavy equipment or power tools. Avoid working on ladders or at heights. Take showers instead of baths. Ensure the water temperature is not too high on the home water heater. Do not go swimming alone. Do not lock yourself in a room alone (i.e. bathroom). When caring for infants or small children, sit down when holding, feeding, or changing them to minimize risk of injury to the child in the event you have a seizure. Maintain good sleep hygiene. Avoid alcohol.  ?  ?If patient has another seizure, call 911 and bring them back to the ED if: ?A.  The seizure lasts longer than 5 minutes.      ?B.  The patient doesn't wake shortly after the seizure or has new problems such as difficulty seeing, speaking or moving following the seizure ?C.  The patient was injured during the seizure ?D.  The patient has a temperature over 102 F (39C) ?E.  The patient vomited during the seizure and now is having trouble breathing ?   ?During the Seizure ?  ?- First, ensure adequate ventilation and place patients on the floor on their left side  ?Loosen clothing around the neck and ensure the airway is patent. If  the patient is clenching the teeth, do not force the mouth open with any object as this can cause severe damage ?- Remove all items from the surrounding that can be hazardous. The patient may be oblivious to what's happening and may not even know what he or she is doing. ?If the patient is confused and wandering, either gently guide him/her away and block access to outside areas ?- Reassure the individual and be comforting ?- Call 911. In most cases, the seizure ends before EMS arrives. However, there are cases when seizures may last over  3 to 5 minutes. Or the individual may have developed breathing difficulties or severe injuries. If a pregnant patient or a person with diabetes develops a seizure, it is prudent to call an ambulance. ?- Finally, if the patient does not regain full consciousness, then call EMS. Most patients will remain confused for about 45 to 90 minutes after a seizure, so you must use judgment in calling for help. ?  ? After the Seizure (Postictal Stage) ?  ?After a seizure, most patients experience confusion, fatigue, muscle pain and/or a headache. Thus, one should permit the individual to sleep. For the next few days, reassurance is essential. Being calm and helping reorient the person is also of importance. ?  ?Most seizures are painless and end spontaneously. Seizures are not harmful to others but can lead to complications such as stress on the lungs, brain and the heart. Individuals with prior lung problems may develop labored breathing and respiratory distress.  ? ?  ?Thank you for allowing [place] to participate in the care of this patient. If you have any further questions, please contact  me or neurohospitalist.  ?  ? ?I have spent a total of  35 minutes with the patient reviewing hospital notes,  test results, labs and examining the patient as well as establishing an assessment and plan that was discussed personally with the patient.  > 50% of time was spent in direct patient care. ?  ? ?Zeb Comfort ?Epilepsy ?[facility] ?For questions after 5pm please refer to AMION to reach the Neurologist on call ? ?

## 2021-11-05 NOTE — Progress Notes (Signed)
? ?Oral Chemotherapy Clinic ?[place]  ?[phone] [phone] ? ?Patient Care Team: ?System, Provider Not In as PCP - General ?Noreene Filbert, MD as Consulting Physician (Radiation Oncology)  ? ?Name of the patient: Nina Johnson  ?[id number]  ?[birth year]  ? ?Date of visit: 11/05/21 ? ?HPI: Patient is a 71 y.o. female with right parietal glioblastoma. The plan is for to proceed with radiation and temozolomide. She will start her radiation tomorrow.  ? ?Reason for Consult: Temodar (temozolomide) oral chemotherapy education. ? ? ?PAST MEDICAL HISTORY: ?Past Medical History:  ?Diagnosis Date  ? Dehiscence of closure of skull or craniotomy 10/04/2021  ? Dehiscence of closure of skull or craniotomy 08/30/2021  ? Glioblastoma ([place])   ? ? ?HEMATOLOGY/ONCOLOGY HISTORY:  ?Oncology History  ?Glioblastoma ([place])  ?10/01/2021 Initial Diagnosis  ? Glioblastoma ([place]) ?  ?10/04/2021 Surgery  ? Re-resection at [place] with Dr. Tommi Rumps; path c/w glioblastoma IDHwt ?  ?10/25/2021 -  Chemotherapy  ? Patient is on Treatment Plan : BRAIN GLIOBLASTOMA Radiation Therapy With Concurrent Temozolomide 75 mg/m2 Daily Followed By Sequential Maintenance Temozolomide x 6-12 cycles  ?   ? ? ?ALLERGIES:  has No Known Allergies. ? ?MEDICATIONS:  ?Current Outpatient Medications  ?Medication Sig Dispense Refill  ? acetaminophen (TYLENOL) 650 MG CR tablet Take 650 mg by mouth every 8 (eight) hours as needed for pain.    ? DOCUSATE SODIUM PO Take 1 tablet by mouth every other day.    ? Iron Polysacch Cmplx-B12-FA (POLY-IRON 150 FORTE) 150-0.025-1 MG CAPS Take 1 capsule (150 mg) by mouth daily. 30 capsule 0  ? levETIRAcetam (KEPPRA) 1000 MG tablet Take 1 tablet (1,000 mg total) by mouth 2 (two) times daily. 60 tablet 0  ? ondansetron (ZOFRAN) 8 MG tablet Take 1 tablet (8 mg total) by mouth 2 (two) times daily as needed (nausea and vomiting). May take 30-60 minutes prior to Temodar administration if nausea/vomiting occurs.  30 tablet 1  ? oxyCODONE (OXY IR/ROXICODONE) 5 MG immediate release tablet Take 5 mg by mouth every 4 (four) hours as needed for moderate pain.    ? pantoprazole (PROTONIX) 40 MG tablet Take 40 mg by mouth daily.    ? temozolomide (TEMODAR) 100 MG capsule Take 1 capsule (100 mg total) by mouth daily. May take on an empty stomach to decrease nausea & vomiting. (Patient not taking: Reported on 11/03/2021) 42 capsule 0  ? temozolomide (TEMODAR) 20 MG capsule Take 1 capsule (20 mg total) by mouth daily. May take on an empty stomach to decrease nausea & vomiting. (Patient not taking: Reported on 11/03/2021) 42 capsule 0  ? ?No current facility-administered medications for this visit.  ? ?Facility-Administered Medications Ordered in Other Visits  ?Medication Dose Route Frequency Provider Last Rate Last Admin  ? 0.9 %  sodium chloride infusion  75 mL/hr Intravenous Continuous Lequita Halt, MD   Stopped at 11/05/21 1054  ? acetaminophen (TYLENOL) tablet 650 mg  650 mg Oral Q8H PRN Lequita Halt, MD   650 mg at 11/04/21 2303  ? chlorhexidine gluconate (MEDLINE KIT) (PERIDEX) 0.12 % solution 15 mL  15 mL Mouth Rinse BID Wynetta Fines T, MD   15 mL at 11/05/21 1050  ? docusate sodium (COLACE) capsule 50 mg  50 mg Oral Verneda Skill, MD   50 mg at 11/04/21 2358  ? enoxaparin (LOVENOX) injection 40 mg  40 mg Subcutaneous Q24H Wynetta Fines T, MD   40 mg at 11/05/21 1050  ?  iron polysaccharides (NIFEREX) capsule 150 mg  150 mg Oral Daily Raiford Noble [place], DO   150 mg at 11/05/21 1050  ? levETIRAcetam (KEPPRA) IVPB 1000 mg/100 mL premix  1,000 mg Intravenous Q12H Lequita Halt, MD   Stopped at 11/05/21 1130  ? LORazepam (ATIVAN) injection 2 mg  2 mg Intravenous Q5 Min x 2 PRN Wynetta Fines T, MD      ? LORazepam (ATIVAN) injection 4 mg  4 mg Intravenous Q5 Min x 2 PRN Wynetta Fines T, MD      ? ondansetron [facility]) tablet 8 mg  8 mg Oral BID PRN Wynetta Fines T, MD      ? oxyCODONE (Oxy IR/ROXICODONE) immediate release tablet 5 mg   5 mg Oral Q4H PRN Lequita Halt, MD   5 mg at 11/04/21 2358  ? pantoprazole (PROTONIX) EC tablet 40 mg  40 mg Oral Daily Wynetta Fines T, MD   40 mg at 11/05/21 1050  ? ? ?VITAL SIGNS: ?There were no vitals taken for this visit. ?There were no vitals filed for this visit.  ?Estimated body mass index is 25.73 kg/m? as calculated from the following: ?  Height as of 11/03/21: $RemoveBef'5\' 2"'MeHPBEQBEv$  (1.575 m). ?  Weight as of 11/03/21: 63.8 kg (140 lb 10.5 oz). ? ?LABS: ?CBC: ?   ?Component Value Date/Time  ? WBC 12.8 (H) 11/05/2021 0931  ? HGB 7.9 (L) 11/05/2021 0931  ? HCT 27.2 (L) 11/05/2021 0931  ? PLT 473 (H) 11/05/2021 0931  ? MCV 73.3 (L) 11/05/2021 0931  ? NEUTROABS 10.5 (H) 11/05/2021 0931  ? LYMPHSABS 1.6 11/05/2021 0931  ? MONOABS 0.6 11/05/2021 0931  ? EOSABS 0.0 11/05/2021 0931  ? BASOSABS 0.0 11/05/2021 0931  ? ?Comprehensive Metabolic Panel: ?   ?Component Value Date/Time  ? NA 138 11/05/2021 0434  ? K 4.2 11/05/2021 0434  ? CL 107 11/05/2021 0434  ? CO2 21 (L) 11/05/2021 0434  ? BUN 8 11/05/2021 0434  ? CREATININE 0.54 11/05/2021 0434  ? GLUCOSE 123 (H) 11/05/2021 0434  ? CALCIUM 8.9 11/05/2021 0434  ? AST 38 11/03/2021 1551  ? ALT 32 11/03/2021 1551  ? ALKPHOS 71 11/03/2021 1551  ? BILITOT 0.5 11/03/2021 1551  ? PROT 6.5 11/03/2021 1551  ? ALBUMIN 3.5 11/03/2021 1551  ? ? ? ?Present during today's visit: patient and her cousin Nina Johnson ? ?Start plan: With her radiation starting tomorrow, she will take her first dose of temozolomide tonight 11/05/21 ?  ?Patient Education ?I spoke with patient for overview of new oral chemotherapy medication: temozolomide  ? ?Administration: ?Counseled patient on administration, dosing, side effects, monitoring, drug-food interactions, safe handling, storage, and disposal. ?Patient will take one $Remove'100mg'zFqvZSK$  capsule and one $Remov'20mg'TQTVTI$  capsule by mouth daily. May take on an empty stomach to decrease nausea & vomiting. ? ?They were instructed to take ondansetron 8 mg 30-60 mins prior to taking the temozolomide  to help to prevent nausea. ?Nina Johnson thinks that she has ondansetron at home already, if not there is an order ready for them to pick up at [facility] outpatient pharmacy ? ?Side Effects: ?Side effects include but not limited to: nausea, constipation, decreased wbc/hgb/plt.   ? ?Drug-drug Interactions (DDI): ?Currently no DDIs ? ?Adherence: ?After discussion with patient no patient barriers to medication adherence identified.  ?Reviewed with patient importance of keeping a medication schedule and plan for any missed doses. ? ?Ms. Donnal Debar and Nina Johnson voiced understanding and appreciation. All questions answered. Medication handout provided. ? ?Provided  patient with Oral Chemotherapy Navigation Clinic phone number. Patient knows to call the office with questions or concerns. Oral Chemotherapy Navigation Clinic will continue to follow. ? ?Patient expressed understanding and was in agreement with this plan. She also understands that She can call clinic at any time with any questions, concerns, or complaints.  ? ?Medication Access Issues: ?No issues patient has temozolomide in hand to get started ? ? ?Thank you for allowing me to participate in the care of this patient.  ? ?Time Total: 25 mins ? ?Visit consisted of counseling and education on dealing with issues of symptom management in the setting of serious and potentially life-threatening illness.Greater than 50%  of this time was spent counseling and coordinating care related to the above assessment and plan. ? ?Signed by: ?Darl Pikes, PharmD, BCPS, BCOP, CPP ?Hematology/Oncology Clinical Pharmacist Practitioner ?[place]/[facility] Oral Chemotherapy Navigation Clinic ?[phone] ? ?11/05/2021 4:04 PM ? ?

## 2021-11-05 NOTE — Progress Notes (Signed)
LTM EEG hooked up and running - no initial skin breakdown - push button tested - neuro notified. [facility] monitoring. ?Difficult hook-up due to patient being uncomfortable and extremely restless. ?

## 2021-11-05 NOTE — Evaluation (Signed)
Physical Therapy Evaluation ?Patient Details ?Name: Nina Johnson ?MRN: [id number] ?DOB: 18-Jul-1951 ?Today's Date: 11/05/2021 ? ?History of Present Illness ? Pt is a 71 y/o female admitted secondary to seizures. PMH includes brain tumor s/p craniotomy and seizures.  ?Clinical Impression ? Pt admitted secondary to problem above with deficits below. Pt requiring min guard to min A for mobility tasks this session. Decreased safety awareness and easily distracted throughout. Required continuous cues for safety during mobility. Recommending use of RW at home to increase safety and will need to ensure pt's family able to provide necessary supervision. Recommending outpatient PT to address current deficits. Will continue to follow acutely.    ?   ? ?Recommendations for follow up therapy are one component of a multi-disciplinary discharge planning process, led by the attending physician.  Recommendations may be updated based on patient status, additional functional criteria and insurance authorization. ? ?Follow Up Recommendations Outpatient PT ? ?  ?Assistance Recommended at Discharge Frequent or constant Supervision/Assistance  ?Patient can return home with the following ? A little help with walking and/or transfers;A little help with bathing/dressing/bathroom;Assistance with cooking/housework;Direct supervision/assist for financial management;Direct supervision/assist for medications management;Assist for transportation;Help with stairs or ramp for entrance ? ?  ?Equipment Recommendations Rolling walker (2 wheels)  ?Recommendations for Other Services ?    ?  ?Functional Status Assessment Patient has had a recent decline in their functional status and demonstrates the ability to make significant improvements in function in a reasonable and predictable amount of time.  ? ?  ?Precautions / Restrictions Precautions ?Precautions: Fall ?Restrictions ?Weight Bearing Restrictions: No  ? ?  ? ?Mobility ? Bed Mobility ?  ?  ?  ?  ?   ?  ?  ?General bed mobility comments: Sitting EOB with OT upon entry ?  ? ?Transfers ?Overall transfer level: Needs assistance ?Equipment used: None ?Transfers: Sit to/from Stand ?Sit to Stand: Min guard ?  ?  ?  ?  ?  ?General transfer comment: Min guard for safety. ?  ? ?Ambulation/Gait ?Ambulation/Gait assistance: Min guard, Min assist ?Gait Distance (Feet): 30 Feet ?Assistive device: 1 person hand held assist, None ?Gait Pattern/deviations: Step-through pattern, Decreased dorsiflexion - right ?Gait velocity: Decreased ?  ?  ?General Gait Details: Pt with unsteadiness and reaching out to hold to objects in room. Easily distracted and required cues to stay on task. Min guard to min A for steadying ? ?Stairs ?  ?  ?  ?  ?  ? ?Wheelchair Mobility ?  ? ?Modified Rankin (Stroke Patients Only) ?  ? ?  ? ?Balance Overall balance assessment: Needs assistance ?Sitting-balance support: No upper extremity supported, Feet supported ?Sitting balance-Leahy Scale: Fair ?  ?  ?Standing balance support: No upper extremity supported, Single extremity supported ?Standing balance-Leahy Scale: Fair ?Standing balance comment: Poor dynamic ?  ?  ?  ?  ?  ?  ?  ?  ?  ?  ?  ?   ? ? ? ?Pertinent Vitals/Pain Pain Assessment ?Pain Assessment: No/denies pain  ? ? ?Home Living Family/patient expects to be discharged to:: Private residence ?Living Arrangements: Children;Other (Comment) (daughter and her cousin) ?Available Help at Discharge: Family;Available 24 hours/day ?Type of Home: House ?Home Access: Stairs to enter ?Entrance Stairs-Rails: Can reach both ?Entrance Stairs-Number of Steps: 3 ?  ?Home Layout: One level ?Home Equipment: Shower seat ?   ?  ?Prior Function Prior Level of Function : Independent/Modified Independent ?  ?  ?  ?  ?  ?  ?  Mobility Comments: independent mobility ?ADLs Comments: independent ADLs, IADLs, manages meds; cousin drives her to appts ?  ? ? ?Hand Dominance  ? Dominant Hand: Right ? ?  ?Extremity/Trunk  Assessment  ? Upper Extremity Assessment ?Upper Extremity Assessment: Defer to OT evaluation ?  ? ?Lower Extremity Assessment ?Lower Extremity Assessment: Generalized weakness ?  ? ?Cervical / Trunk Assessment ?Cervical / Trunk Assessment: Normal  ?Communication  ? Communication: Expressive difficulties (slow to respond)  ?Cognition Arousal/Alertness: Awake/alert ?Behavior During Therapy: Flat affect, Impulsive ?Overall Cognitive Status: No family/caregiver present to determine baseline cognitive functioning ?  ?  ?  ?  ?  ?  ?  ?  ?  ?  ?  ?  ?  ?  ?  ?  ?General Comments: Disoriented to place and asked where she was. Reported it was February initially, but then reported it was march. Increased processing time and decreased safety awareness. required cues for sequencing. ?  ?  ? ?  ?General Comments General comments (skin integrity, edema, etc.): No family present ? ?  ?Exercises    ? ?Assessment/Plan  ?  ?PT Assessment Patient needs continued PT services  ?PT Problem List Decreased strength;Decreased activity tolerance;Decreased balance;Decreased mobility;Decreased cognition;Decreased knowledge of use of DME;Decreased safety awareness;Decreased knowledge of precautions ? ?   ?  ?PT Treatment Interventions DME instruction;Gait training;Stair training;Functional mobility training;Therapeutic activities;Balance training;Therapeutic exercise;Patient/family education;Cognitive remediation   ? ?PT Goals (Current goals can be found in the Care Plan section)  ?Acute Rehab PT Goals ?Patient Stated Goal: none stated ?PT Goal Formulation: With patient ?Time For Goal Achievement: 11/19/21 ?Potential to Achieve Goals: Good ? ?  ?Frequency Min 3X/week ?  ? ? ?Co-evaluation   ?  ?  ?  ?  ? ? ?  ?AM-PAC PT "6 Clicks" Mobility  ?Outcome Measure Help needed turning from your back to your side while in a flat bed without using bedrails?: A Little ?Help needed moving from lying on your back to sitting on the side of a flat bed  without using bedrails?: A Little ?Help needed moving to and from a bed to a chair (including a wheelchair)?: A Little ?Help needed standing up from a chair using your arms (e.g., wheelchair or bedside chair)?: A Little ?Help needed to walk in hospital room?: A Little ?Help needed climbing 3-5 steps with a railing? : A Lot ?6 Click Score: 17 ? ?  ?End of Session Equipment Utilized During Treatment: Gait belt ?Activity Tolerance: Patient tolerated treatment well ?Patient left: in bed;with call bell/phone within reach;with bed alarm set ?Nurse Communication: Mobility status ?PT Visit Diagnosis: Unsteadiness on feet (R26.81);Muscle weakness (generalized) (M62.81) ?  ? ?Time: 1010-1029 ?PT Time Calculation (min) (ACUTE ONLY): 19 min ? ? ?Charges:   PT Evaluation ?$PT Eval Moderate Complexity: 1 Mod ?  ?  ?   ? ? ?Reuel Derby, PT, DPT  ?Acute Rehabilitation Services  ?Pager: [phone] ?Office: [phone] ? ? ?[place] ?11/05/2021, 10:48 AM ? ?

## 2021-11-05 NOTE — Discharge Summary (Signed)
?Physician Discharge Summary ?  ?Patient: Annessa Satre MRN: [id number] DOB: 1951/04/17  ?Admit date:     11/04/2021  ?Discharge date: 11/05/21  ?Discharge Physician: Kerney Elbe  ? ?PCP: System, Provider Not In  ? ?Recommendations at discharge:  ? ? Follow up with PCP within 1-2 weeks and repeat CBC, CMP, Mag, Phos ?Follow up with Neuro-Oncology Dr. Mickeal Skinner and has an appointment with him following this hospitalization ?Follow up with Neurology and continue Keppra 1000 mg po BID ?SEIZURE PRECAUTIONS ?Per [facility] statutes, patients with seizures are not allowed to drive until they have been seizure-free for six months.  ? ?Use caution when using heavy equipment or power tools. Avoid working on ladders or at heights. Take showers instead of baths. Ensure the water temperature is not too high on the home water heater. Do not go swimming alone. Do not lock yourself in a room alone (i.e. bathroom). When caring for infants or small children, sit down when holding, feeding, or changing them to minimize risk of injury to the child in the event you have a seizure. Maintain good sleep hygiene. Avoid alcohol.  ?  ?If patient has another seizure, call 911 and bring them back to the ED if: ?A.  The seizure lasts longer than 5 minutes.      ?B.  The patient doesn't wake shortly after the seizure or has new problems such as difficulty seeing, speaking or moving following the seizure ?C.  The patient was injured during the seizure ?D.  The patient has a temperature over 102 F (39C) ?E.  The patient vomited during the seizure and now is having trouble breathing ? ? ?Discharge Diagnoses: ?Principal Problem: ?  Seizure ([place]) ?Active Problems: ?  Epilepsy ([place]) ? ?Resolved Problems: ?  * No resolved hospital problems. * ? ?Hospital Course: ?HPI per Dr. Wynetta Fines on 11/04/21 ?Emrie Gayle is a 71 y.o. female with medical history significant of glioblastoma s/p craniotomy January 20 103 at [facility] and revision of  dehiscence from first craniotomy in February 2023, new onset seizure December 2022, OA, GERD, chronic microcytic anemia, was brought in to [facility] for concerns of seizure. ?  ?Patient does not remember details of the episode, as per family's report at [facility], patient had tonic-clonic seizure at home.  Episode lasted about 2 minutes, no urinary or bowel incontinence.  Patient told me that antiseizure medication dosage has been cut down by her doctor but he could not provide more detailed information at this point. ?  ?At [facility] ER, patient was loaded with Keppra 1000 mg, EEG showed evidence of epileptogenicity arising from the right hemisphere likely related to underlying craniotomy.  As per neurology recommendation, patient transferred to [facility] for continuous EEG monitoring. ?  ?At the time I visited patient, patient complained about generalized weakness and feeling drowsy, denies any weakness numbness of any of the limbs, no headache no vision changes. ? ?**Interim History ?She was evaluated by the neurology team and was told to do long-term EEG monitoring.  The EEG was done and was suggestive of cortical dysfunction present on right hemispheres likely secondary to underlying lesion and craniotomy.  There is no seizures or definitive epileptiform discharges seen throughout the study.  Neurology evaluated and recommending continuing Keppra 1000 g p.o. twice daily and discontinuing the Depakote.  The patient has further seizures they recommend adding Vimpat and they DC'd the video EEG monitoring.  Patient is on neuro-oncology appointment today as well as radiation oncology and will  defer further management to the neuro oncologist.  We will continue seizure precautions and patient was deemed medically stable to be discharged home with outpatient PT and OT. ? ?Assessment and Plan: ?Seizure, breakthrough versus noncoherent with seizure medication ?-As per neurology, continue Keppra 1000 mg IV twice daily, and Depakote 500 mg 3  times daily but her Depakote was discontinued now ?-Ordered continuous EEG monitoring x24 hours, neurology consult to follow and neurology EEG report is as above ?-As needed Ativan, seizure precaution ?-Admitted to PCU for close monitoring she is stable to be discharged and she will follow-up with her primary neuro oncologist following this hospitalization ?  ?Chronic microcytic anemia ?-Iron level borderline low, recommend outpatient GI work-up for colonoscopy. ?-Anemia panel showed an iron of 22, U IBC 378, TIBC 400, saturation ratio of 6%, ferritin level of 8, folate level greater than 23.5 and vitamin B12 level to see ?  ?GERD ?-Continue PPI ?  ?History of glioblastoma status post craniotomy and excision ?-Follow-up with neurosurgery at [facility]. ?-Was going to be seen by Dr. Mickeal Skinner and her radiation oncologist today ? ?Leukocytosis ?-In the setting of seizure and is trending down.  Patient's WBC went from 6.0 trended up to 14.3 is now 12.8 ?-Continue monitor and trend and repeat CBC in outpatient setting; currently no evidence for any infection ? ?Sinus tachycardia ?-Likely reactive and improved.  Given a liter bolus prior to discharge ? ?Pain control - [employer] Controlled Substance Reporting System database was reviewed. and patient was instructed, not to drive, operate heavy machinery, perform activities at heights, swimming or participation in water activities or provide baby-sitting services while on Pain, Sleep and Anxiety Medications; until their outpatient Physician has advised to do so again. Also recommended to not to take more than prescribed Pain, Sleep and Anxiety Medications.  ? ?Consultants: Neurology ?Procedures performed: Video EEG  ?Disposition: Home with outpatient PT ?Diet recommendation:  ?Discharge Diet Orders (From admission, onward)  ? ?  Start     Ordered  ? 11/05/21 0000  Diet - low sodium heart healthy       ? 11/05/21 1610  ? ?  ?  ? ?  ? ?Cardiac diet ?DISCHARGE  MEDICATION: ?Allergies as of 11/05/2021   ?No Known Allergies ?  ? ?  ?Medication List  ?  ? ?STOP taking these medications   ? ?levETIRAcetam 1000 MG/100ML Soln ?Commonly known as: KEPPRA ?  ?LORazepam 2 MG/ML injection ?Commonly known as: ATIVAN ?  ?ondansetron 4 MG disintegrating tablet ?Commonly known as: ZOFRAN-ODT ?  ?valproic acid 250 MG/5ML solution ?Commonly known as: DEPAKENE ?  ? ?  ? ?TAKE these medications   ? ?acetaminophen 650 MG CR tablet ?Commonly known as: TYLENOL ?Take 650 mg by mouth every 8 (eight) hours as needed for pain. ?  ?DOCUSATE SODIUM PO ?Take 1 tablet by mouth every other day. ?  ?levETIRAcetam 1000 MG tablet ?Commonly known as: KEPPRA ?Take 1 tablet (1,000 mg total) by mouth 2 (two) times daily. ?What changed:  ?medication strength ?how much to take ?  ?ondansetron 8 MG tablet ?Commonly known as: Zofran ?Take 1 tablet (8 mg total) by mouth 2 (two) times daily as needed (nausea and vomiting). May take 30-60 minutes prior to Temodar administration if nausea/vomiting occurs. ?  ?oxyCODONE 5 MG immediate release tablet ?Commonly known as: Oxy IR/ROXICODONE ?Take 5 mg by mouth every 4 (four) hours as needed for moderate pain. ?  ?pantoprazole 40 MG tablet ?Commonly known as: PROTONIX ?Take  40 mg by mouth daily. ?  ?Poly-Iron 150 Forte 150-0.025-1 MG Caps ?Generic drug: Iron Polysacch Cmplx-B12-FA ?Take 1 capsule (150 mg) by mouth daily. ?  ?temozolomide 20 MG capsule ?Commonly known as: TEMODAR ?Take 1 capsule (20 mg total) by mouth daily. May take on an empty stomach to decrease nausea & vomiting. ?  ?temozolomide 100 MG capsule ?Commonly known as: TEMODAR ?Take 1 capsule (100 mg total) by mouth daily. May take on an empty stomach to decrease nausea & vomiting. ?  ? ?  ? ? ?Discharge Exam: ?There were no vitals filed for this visit. ?Vitals:  ? 11/05/21 0512 11/05/21 1125  ?BP:  117/60  ?Pulse:  99  ?Resp:  17  ?Temp:  99.2 ?F (37.3 ?C)  ?SpO2: 93% 94%  ? ?Examination: ?Physical  Exam: ? ?Constitutional: WN/WD overweight Caucasian female in NAD ?Respiratory: Diminished to auscultation bilaterally, no wheezing, rales, rhonchi or crackles. Normal respiratory effort and patient is not tachypenic. No accessory muscle u

## 2021-11-05 NOTE — Procedures (Addendum)
Patient Name: Nina Johnson  ?MRN: [id number]  ?Epilepsy Attending: Lora Havens  ?Referring Physician/Provider: Dr Zeb Comfort ?Duration: 11/04/2021 2309 to 11/05/2021 1497 ?  ?Patient history: 71yo F with h/o brain tumor s/p resection, seizure who presented with seizure. EEG to evaluate for seizure ?  ?Level of alertness: Awake, sleep ? ?AEDs during EEG study: LEV, VPA ?  ?Technical aspects: This EEG study was done with scalp electrodes positioned according to the 10-20 International system of electrode placement. Electrical activity was acquired at a sampling rate of '500Hz'$  and reviewed with a high frequency filter of '70Hz'$  and a low frequency filter of '1Hz'$ . EEG data were recorded continuously and digitally stored.  ?  ?Description: The posterior dominant rhythm consists of 8-9 Hz activity of moderate voltage (25-35 uV) seen predominantly in posterior head regions, symmetric and reactive to eye opening and eye closing.  Sleep was characterized by vertex waves, sleep spindles (12 to 14 Hz), maximal frontocentral region. EEG also showed continuous 3 to 5 Hz theta-delta  slowing in right hemisphere. Hyperventilation and photic stimulation were not performed.    ?  ?ABNORMALITY ?- Continuous slow, right hemisphere ?  ?IMPRESSION: ?This study is suggestive of cortical dysfunction arising from right hemisphere likely secondary to underlying lesion, craniotomy.  No seizures or definite epileptiform discharges were seen during the study. ?  ?Lora Havens  ?

## 2021-11-05 NOTE — Evaluation (Signed)
Occupational Therapy Evaluation ?Patient Details ?Name: Nina Johnson ?MRN: [id number] ?DOB: [birth year] ?Today's Date: 11/05/2021 ? ? ?History of Present Illness Pt is a 71 y/o female admitted secondary to seizures. PMH includes brain tumor s/p craniotomy and seizures.  ? ?Clinical Impression ?  ?PT admitted for above and presents with problem list below, including impaired cognition, balance, generalized weakness, L UE coordination, L inattention. Pt disoriented to time and place, requires increased time/repetition to follow simple 1 step commands with decreased attention, poor awareness to deficits, safety and problem solving.  Pt requires up to max assist for toileting, mod assist for UB/LB ADLs.  She requires cueing to sequence hand washing after toileting, with no awareness to having had BM in bathroom.  Pt will requires 24/7 support at discharge, assist with all IADLS and hands on support for ADLS and mobility.  Pt reports she has good support from daughter and cousin.  Based on performance today, recommend continued OT services acutely and OPOT after dc.   ?   ? ?Recommendations for follow up therapy are one component of a multi-disciplinary discharge planning process, led by the attending physician.  Recommendations may be updated based on patient status, additional functional criteria and insurance authorization.  ? ?Follow Up Recommendations ? Outpatient OT  ?  ?Assistance Recommended at Discharge Frequent or constant Supervision/Assistance  ?Patient can return home with the following A little help with walking and/or transfers;A lot of help with bathing/dressing/bathroom;Assistance with cooking/housework;Direct supervision/assist for medications management;Direct supervision/assist for financial management;Assist for transportation;Help with stairs or ramp for entrance ? ?  ?Functional Status Assessment ? Patient has had a recent decline in their functional status and demonstrates the ability to make  significant improvements in function in a reasonable and predictable amount of time.  ?Equipment Recommendations ? BSC/3in1;Other (comment) (RW)  ?  ?Recommendations for Other Services Speech consult ? ? ?  ?Precautions / Restrictions Precautions ?Precautions: Fall ?Restrictions ?Weight Bearing Restrictions: No  ? ?  ? ?Mobility Bed Mobility ?Overal bed mobility: Needs Assistance ?Bed Mobility: Supine to Sit, Sit to Supine ?  ?  ?Supine to sit: Min assist ?Sit to supine: Min assist ?  ?General bed mobility comments: for trunk support to ascend ?  ? ?Transfers ?  ?  ?  ?  ?  ?  ?  ?  ?  ?  ?  ? ?  ?Balance Overall balance assessment: Needs assistance ?Sitting-balance support: No upper extremity supported, Feet supported ?Sitting balance-Leahy Scale: Fair ?  ?  ?Standing balance support: No upper extremity supported, Single extremity supported ?Standing balance-Leahy Scale: Fair ?Standing balance comment: Poor dynamic ?  ?  ?  ?  ?  ?  ?  ?  ?  ?  ?  ?   ? ?ADL either performed or assessed with clinical judgement  ? ?ADL Overall ADL's : Needs assistance/impaired ?  ?  ?Grooming: Minimal assistance;Wash/dry hands;Standing ?Grooming Details (indicate cue type and reason): cueing to sequence steps ?  ?  ?  ?  ?Upper Body Dressing : Moderate assistance;Sitting ?  ?Lower Body Dressing: Moderate assistance;Sit to/from stand ?Lower Body Dressing Details (indicate cue type and reason): require assist for socks, sequencing. Min guard sit to stand ?Toilet Transfer: Minimal assistance;Ambulation ?  ?Toileting- Clothing Manipulation and Hygiene: Maximal assistance;Sit to/from stand ?Toileting - Clothing Manipulation Details (indicate cue type and reason): pt with poor coordination, sequencing and awareness.  Wiping over gown, poor mgmt of toilet paper and no awareness of having BM  on commdoe. ?  ?  ?Functional mobility during ADLs: Minimal assistance;+2 for safety/equipment ?   ? ? ? ?Vision Baseline Vision/History: 1 Wears  glasses (reading) ?Ability to See in Adequate Light: 0 Adequate ?Patient Visual Report: Other (comment) (reports "different" but unable to describe) ?Vision Assessment?: Vision impaired- to be further tested in functional context ?Additional Comments: requires head turns with tracking and quadrent testing.  minimal overshooting with L UE (? vision vs coordination).  Continue assessment  ?   ?Perception   ?  ?Praxis   ?  ? ?Pertinent Vitals/Pain Pain Assessment ?Pain Assessment: No/denies pain  ? ? ? ?Hand Dominance Right ?  ?Extremity/Trunk Assessment Upper Extremity Assessment ?Upper Extremity Assessment: Generalized weakness;LUE deficits/detail;RUE deficits/detail ?RUE Deficits / Details: reports hx of UE fracture? limited shoulder flexion to 90*. reports decreased sensation in fingers. ?RUE Sensation: decreased light touch ?RUE Coordination: WNL ?LUE Deficits / Details: decreased coordination, sensation.  poor awareness of UE. ?LUE Sensation: decreased light touch;decreased proprioception ?LUE Coordination: decreased fine motor;decreased gross motor ?  ?Lower Extremity Assessment ?Lower Extremity Assessment: Defer to PT evaluation ?  ?Cervical / Trunk Assessment ?Cervical / Trunk Assessment: Normal ?  ?Communication Communication ?Communication: Expressive difficulties (slow to respond) ?  ?Cognition Arousal/Alertness: Awake/alert ?Behavior During Therapy: Flat affect, Impulsive ?Overall Cognitive Status: Impaired/Different from baseline ?Area of Impairment: Orientation, Attention, Memory, Following commands, Safety/judgement, Awareness, Problem solving ?  ?  ?  ?  ?  ?  ?  ?  ?Orientation Level: Disoriented to, Place, Time ?Current Attention Level: Sustained ?Memory: Decreased recall of precautions, Decreased short-term memory ?Following Commands: Follows one step commands inconsistently, Follows one step commands with increased time ?Safety/Judgement: Decreased awareness of safety, Decreased awareness of  deficits ?Awareness: Intellectual ?Problem Solving: Slow processing, Decreased initiation, Difficulty sequencing, Requires verbal cues, Requires tactile cues ?General Comments: Disoriented to place and asked where she was. Reported it was February initially, but then reported it was march. Increased processing time and decreased safety awareness. required cues for sequencing. Poor awareness to deficits. ?  ?  ?General Comments  no family present ? ?  ?Exercises   ?  ?Shoulder Instructions    ? ? ?Home Living Family/patient expects to be discharged to:: Private residence ?Living Arrangements: Children;Other (Comment) (daughter and her cousin) ?Available Help at Discharge: Family;Available 24 hours/day ?Type of Home: House ?Home Access: Stairs to enter ?Entrance Stairs-Number of Steps: 3 ?Entrance Stairs-Rails: Can reach both ?Home Layout: One level ?  ?  ?Bathroom Shower/Tub: Tub/shower unit ?  ?Bathroom Toilet: Standard ?Bathroom Accessibility: Yes ?  ?Home Equipment: Shower seat ?  ?  ?  ? ?  ?Prior Functioning/Environment Prior Level of Function : Independent/Modified Independent ?  ?  ?  ?  ?  ?  ?Mobility Comments: independent mobility ?ADLs Comments: independent ADLs, IADLs, manages meds; cousin drives her to appts ?  ? ?  ?  ?OT Problem List: Decreased strength;Decreased activity tolerance;Impaired balance (sitting and/or standing);Impaired vision/perception;Decreased coordination;Decreased cognition;Decreased safety awareness;Decreased knowledge of use of DME or AE;Decreased knowledge of precautions;Impaired UE functional use;Impaired sensation ?  ?   ?OT Treatment/Interventions: Self-care/ADL training;Neuromuscular education;DME and/or AE instruction;Therapeutic activities;Cognitive remediation/compensation;Visual/perceptual remediation/compensation;Patient/family education;Balance training  ?  ?OT Goals(Current goals can be found in the care plan section) Acute Rehab OT Goals ?Patient Stated Goal: none  stated ?Time For Goal Achievement: 11/19/21 ?Potential to Achieve Goals: Good  ?OT Frequency: Min 2X/week ?  ? ?Co-evaluation   ?  ?  ?  ?  ? ?  ?  AM-PAC OT "6 Clicks" Daily Activity     ?Outcome Measure Help from another

## 2021-11-05 NOTE — Progress Notes (Addendum)
Pt has been trying to have a BM all night.  Pt has made numerous trips to bathroom without any success.  Dr. Olena Heckle paged due to pt wanting immediate relief from stomach cramping she was feeling from not being able to have a BM.  Fleets enema was given but pt unable to hold enema in for any length of time before ejecting clear fluid back out.  Pt was offered prune juice and hot tea but declined.  PT was still c/o cramping 4/10 pain and tylenol was administered.  Pt still c/o pain 6/10 and oxy and PRN colace was administered. Heat packs placed on abdomen for comfort.  Will continue to monitor. ?

## 2021-11-05 NOTE — Progress Notes (Signed)
OT NOTE: ? ?Pt seen for OT evaluation, pt reports having good support at home from daughter and cousin.  Her cousin provides transportation to appointments.  Pt reports managing her medications.  At this time recommend assist for ALL IADLS,  recommend 07/5 assist with hands on support during mobility and ALDs.  Recommend outpatient neuro follow up.  Full note to follow.   ? ?Jolaine Artist, OT ?Acute Rehabilitation Services ?Pager [phone] ?Office [phone] ? ?

## 2021-11-05 NOTE — TOC Initial Note (Signed)
Transition of Care (TOC) - Initial/Assessment Note  ? ? ?Patient Details  ?Name: Nina Johnson ?MRN: [id number] ?Date of Birth: 07/18/1951 ? ?Transition of Care (TOC) CM/SW Contact:    ?Angelita Ingles, RN ?Phone [phone] ? ?11/05/2021, 12:55 PM ? ?Clinical Narrative:                 ?DME rolling walker ordered per [facility]. Ambulatory referral orders have been entered per MD. NO other needs noted at this time.  ? ?  ?Barriers to Discharge: No Barriers Identified ? ? ?Patient Goals and CMS Choice ?  ?  ?Choice offered to / list presented to : NA ? ?Expected Discharge Plan and Services ?  ?  ?  ?  ?  ?Expected Discharge Date: 11/05/21               ?DME Arranged: Walker rolling ?DME Agency: [employer] ?Date DME Agency Contacted: 11/05/21 ?Time DME Agency Contacted: [phone] ?Representative spoke with at DME Agency: Delana Meyer ?HH Arranged:  (MD entered orders ambulatory referral for outpatient therapy) ?  ?  ?  ?  ? ?Prior Living Arrangements/Services ?  ?  ?  ?       ?  ?  ?  ?  ? ?Activities of Daily Living ?  ?  ? ?Permission Sought/Granted ?  ?  ?   ?   ?   ?   ? ?Emotional Assessment ?  ?  ?  ?  ?  ?  ? ?Admission diagnosis:  Seizure ([place]) [R56.9] ?Patient Active Problem List  ? Diagnosis Date Noted  ? Seizure ([place]) 11/03/2021  ? GERD (gastroesophageal reflux disease) 11/03/2021  ? OA (osteoarthritis) 11/03/2021  ? Tongue biting 11/03/2021  ? Brain neoplasm ([place]) 10/25/2021  ? Epilepsy ([place]) 10/03/2021  ? Glioblastoma ([place]) 10/01/2021  ? ?PCP:  System, Provider Not In ?Pharmacy:   ?[facility] ?[address] ?[place] [address] ?Phone: [phone] Fax: [phone] ? ?[facility] [place] ?[address] ?[place] [address] ?Phone: [phone] Fax: [phone] ? ?[facility] ?[address] ?[place] [address] ?Phone: [phone] Fax: [phone] ? ? ? ? ?Social Determinants of Health (SDOH)  Interventions ?  ? ?Readmission Risk Interventions ?No flowsheet data found. ? ? ?

## 2021-11-06 ENCOUNTER — Ambulatory Visit: Payer: Medicare Other

## 2021-11-06 ENCOUNTER — Ambulatory Visit
Admission: RE | Admit: 2021-11-06 | Discharge: 2021-11-06 | Disposition: A | Discharge status: Home / Self Care | Payer: Medicare Other | Source: Ambulatory Visit | Attending: Radiation Oncology | Admitting: Radiation Oncology

## 2021-11-06 DIAGNOSIS — Z510 Encounter for antineoplastic radiation therapy: Secondary | ICD-10-CM | POA: Diagnosis not present

## 2021-11-07 ENCOUNTER — Encounter: Payer: Self-pay | Admitting: Internal Medicine

## 2021-11-07 ENCOUNTER — Other Ambulatory Visit: Payer: Self-pay | Admitting: *Deleted

## 2021-11-07 ENCOUNTER — Ambulatory Visit
Admission: RE | Admit: 2021-11-07 | Discharge: 2021-11-07 | Disposition: A | Discharge status: Home / Self Care | Payer: Medicare Other | Source: Ambulatory Visit | Attending: Radiation Oncology | Admitting: Radiation Oncology

## 2021-11-07 ENCOUNTER — Inpatient Hospital Stay (HOSPITAL_BASED_OUTPATIENT_CLINIC_OR_DEPARTMENT_OTHER): Payer: Medicare Other | Admitting: Hospice and Palliative Medicine

## 2021-11-07 ENCOUNTER — Other Ambulatory Visit: Payer: Self-pay

## 2021-11-07 VITALS — BP 111/68 | HR 91 | Temp 98.7°F | Resp 16

## 2021-11-07 DIAGNOSIS — G4700 Insomnia, unspecified: Secondary | ICD-10-CM

## 2021-11-07 DIAGNOSIS — Z510 Encounter for antineoplastic radiation therapy: Secondary | ICD-10-CM | POA: Diagnosis not present

## 2021-11-07 DIAGNOSIS — C719 Malignant neoplasm of brain, unspecified: Secondary | ICD-10-CM

## 2021-11-07 MED ORDER — TRAZODONE HCL 50 MG PO TABS
25.0000 mg | ORAL_TABLET | Freq: Every evening | ORAL | 2 refills | Status: DC | PRN
  Filled 2021-11-07: qty 30, 30d supply, fill #0

## 2021-11-07 MED ORDER — DEXAMETHASONE 4 MG PO TABS
4.0000 mg | ORAL_TABLET | Freq: Two times a day (BID) | ORAL | 0 refills | Status: DC
  Filled 2021-11-07: qty 60, 30d supply, fill #0

## 2021-11-07 MED ORDER — OXYCODONE HCL 5 MG PO TABS
5.0000 mg | ORAL_TABLET | ORAL | 0 refills | Status: DC | PRN
  Filled 2021-11-07: qty 30, 5d supply, fill #0

## 2021-11-07 NOTE — Progress Notes (Signed)
Pt reports that she is not sleeping well. She states that ativan helped a lot when she was in the hospital. Would like to know if that would be an option. She would also like to know if there were any changes between MRI's to explain recent seizures.  ?

## 2021-11-07 NOTE — Addendum Note (Signed)
Addended by: Altha Harm R on: 11/07/2021 01:36 PM ? ? Modules accepted: Orders ? ?

## 2021-11-07 NOTE — Progress Notes (Addendum)
? ?[facility] ?[place] [facility] ?[phone] [phone] ? ?Patient Care Team: ?System, Provider Not In as PCP - General ?Noreene Filbert, MD as Consulting Physician (Radiation Oncology)  ? ?Name of the patient: Nina Johnson  ?[id number]  ?Jun 07, 1951  ? ?Date of visit: 11/07/21 ? ?Reason for Consult: ?Nina Johnson is a 71 y.o. female with multiple medical problems including glioblastoma status post [facility] Heal me January 2023 at [facility] and revision of dehiscence from first craniotomy in February 2023, history of seizures since December 2022.  Patient was hospitalized 11/04/2021-11/05/2021 with seizures.  Patient was transferred to [facility] for continuous EEG monitoring with EEG suggestive of cortical dysfunction right hemisphere likely secondary to underlying lesion/craniotomy.  Neurology recommended continuing Keppra and discontinuing Depakote.  Patient was recommended to add Vimpat for further seizures. ? ?Patient is receiving RT.  She was referred to [facility] by radiation oncology to discuss insomnia. ? ?Patient endorses chronic insomnia in the past months.  She has difficulty initiating sleep and often lies in bed hours before she is able to sleep.  She also states she is has difficulty maintaining sleep and often wakes after 3 to 4 hours.  She feels fatigued and is not well rested.  She denies daytime napping.  She is using a TV/bedroom light while she is trying to sleep.  She has tried melatonin 2 mg nightly without improvement.  She says that she received lorazepam during her previous hospitalization and found that to be significantly helpful. ? ?Denies any new neurologic complaints.  No recent seizures. Patient offers no further specific complaints today. ? ?PAST MEDICAL HISTORY: ?Past Medical History:  ?Diagnosis Date  ? Dehiscence of closure of skull or craniotomy 10/04/2021  ? Dehiscence of closure of skull or craniotomy 08/30/2021  ? Glioblastoma ([place])    ? ? ?PAST SURGICAL HISTORY:  ?Past Surgical History:  ?Procedure Laterality Date  ? CRANIOTOMY    ? ? ?HEMATOLOGY/ONCOLOGY HISTORY:  ?Oncology History  ?Glioblastoma ([place])  ?10/01/2021 Initial Diagnosis  ? Glioblastoma ([place]) ?  ?10/04/2021 Surgery  ? Re-resection at [place] with Dr. Tommi Rumps; path c/w glioblastoma IDHwt ?  ?10/25/2021 -  Chemotherapy  ? Patient is on Treatment Plan : BRAIN GLIOBLASTOMA Radiation Therapy With Concurrent Temozolomide 75 mg/m2 Daily Followed By Sequential Maintenance Temozolomide x 6-12 cycles  ?   ? ? ?ALLERGIES:  has No Known Allergies. ? ?MEDICATIONS:  ?Current Outpatient Medications  ?Medication Sig Dispense Refill  ? acetaminophen (TYLENOL) 650 MG CR tablet Take 650 mg by mouth every 8 (eight) hours as needed for pain.    ? DOCUSATE SODIUM PO Take 1 tablet by mouth every other day.    ? Iron Polysacch Cmplx-B12-FA (POLY-IRON 150 FORTE) 150-0.025-1 MG CAPS Take 1 capsule (150 mg) by mouth daily. 30 capsule 0  ? levETIRAcetam (KEPPRA) 1000 MG tablet Take 1 tablet (1,000 mg total) by mouth 2 (two) times daily. 60 tablet 0  ? ondansetron (ZOFRAN) 8 MG tablet Take 1 tablet (8 mg total) by mouth 2 (two) times daily as needed (nausea and vomiting). May take 30-60 minutes prior to Temodar administration if nausea/vomiting occurs. 30 tablet 1  ? oxyCODONE (OXY IR/ROXICODONE) 5 MG immediate release tablet Take 5 mg by mouth every 4 (four) hours as needed for moderate pain.    ? pantoprazole (PROTONIX) 40 MG tablet Take 40 mg by mouth daily.    ? temozolomide (TEMODAR) 100 MG capsule Take 1 capsule (100 mg total) by mouth daily. May take  on an empty stomach to decrease nausea & vomiting. (Patient not taking: Reported on 11/03/2021) 42 capsule 0  ? temozolomide (TEMODAR) 20 MG capsule Take 1 capsule (20 mg total) by mouth daily. May take on an empty stomach to decrease nausea & vomiting. (Patient not taking: Reported on 11/03/2021) 42 capsule 0  ? ?No current facility-administered medications for this  visit.  ? ? ?VITAL SIGNS: ?BP 111/68   Pulse 91   Temp 98.7 ?F (37.1 ?C) (Tympanic)   Resp 16   SpO2 98%  ?There were no vitals filed for this visit.  ?Estimated body mass index is 25.73 kg/m? as calculated from the following: ?  Height as of 11/03/21: '5\' 2"'$  (1.575 m). ?  Weight as of 11/03/21: 140 lb 10.5 oz (63.8 kg). ? ?LABS: ?CBC: ?   ?Component Value Date/Time  ? WBC 12.8 (H) 11/05/2021 0931  ? HGB 7.9 (L) 11/05/2021 0931  ? HCT 27.2 (L) 11/05/2021 0931  ? PLT 473 (H) 11/05/2021 0931  ? MCV 73.3 (L) 11/05/2021 0931  ? NEUTROABS 10.5 (H) 11/05/2021 0931  ? LYMPHSABS 1.6 11/05/2021 0931  ? MONOABS 0.6 11/05/2021 0931  ? EOSABS 0.0 11/05/2021 0931  ? BASOSABS 0.0 11/05/2021 0931  ? ?Comprehensive Metabolic Panel: ?   ?Component Value Date/Time  ? NA 138 11/05/2021 0434  ? K 4.2 11/05/2021 0434  ? CL 107 11/05/2021 0434  ? CO2 21 (L) 11/05/2021 0434  ? BUN 8 11/05/2021 0434  ? CREATININE 0.54 11/05/2021 0434  ? GLUCOSE 123 (H) 11/05/2021 0434  ? CALCIUM 8.9 11/05/2021 0434  ? AST 38 11/03/2021 1551  ? ALT 32 11/03/2021 1551  ? ALKPHOS 71 11/03/2021 1551  ? BILITOT 0.5 11/03/2021 1551  ? PROT 6.5 11/03/2021 1551  ? ALBUMIN 3.5 11/03/2021 1551  ? ? ?RADIOGRAPHIC STUDIES: ?CT HEAD WO CONTRAST (5MM) ? ?Result Date: 11/03/2021 ?CLINICAL DATA:  Head trauma.  Seizure.  History of glioblastoma. EXAM: CT HEAD WITHOUT CONTRAST CT CERVICAL SPINE WITHOUT CONTRAST TECHNIQUE: Multidetector CT imaging of the head and cervical spine was performed following the standard protocol without intravenous contrast. Multiplanar CT image reconstructions of the cervical spine were also generated. RADIATION DOSE REDUCTION: This exam was performed according to the departmental dose-optimization program which includes automated exposure control, adjustment of the mA and/or kV according to patient size and/or use of iterative reconstruction technique. COMPARISON:  10/05/2021 head MRI from [facility]. 08/30/2021 head CT from [facility]. FINDINGS: CT HEAD FINDINGS Brain: Sequelae of right parietal tumor resection are again identified. A cystic area in the right parietal lobe appears larger than on the prior MRI and may reflect a resection cavity or cystic/necrotic tumor. Additional low-density in the surrounding white matter suggestive of edema has mildly increased. There is no midline shift or other significant mass effect. Trace extra-axial fluid is noted subjacent to the craniotomy. No acute cortically based infarct or acute intracranial hemorrhage is identified. The lateral and third ventricles are normal in size. There is ex vacuo dilatation of the fourth ventricle related to prominent inferior cerebellar atrophy. Vascular: Calcified atherosclerosis at the skull base. No hyperdense vessel. Skull: Right parietal craniotomy. Sinuses/Orbits: Visualized paranasal sinuses and mastoid air cells are clear. Unremarkable orbits. Other: None. CT CERVICAL SPINE FINDINGS Alignment: Straightening of the normal cervical lordosis. Trace anterolisthesis of C6 on C7. Left convex curvature of the cervical spine. Skull base and vertebrae: No acute fracture or suspicious osseous lesion. Soft tissues and spinal canal: No prevertebral fluid or swelling.  No visible canal hematoma. Disc levels: Focally advanced disc degeneration at C5-6 where there is severe disc space narrowing and uncovertebral spurring with mild bilateral neural foraminal stenosis. Upper chest: Nonspecific mosaic attenuation in the lung apices. Other: Subcentimeter bilateral thyroid nodules for which no follow-up imaging is recommended. IMPRESSION: 1. Right parietal glioblastoma with mildly increased regional low density which may reflect edema and/or tumor. No significant mass effect. 2. No evidence of an acute infarct or intracranial hemorrhage. 3. No acute cervical spine fracture. Electronically Signed   By: Logan Bores M.D.   On: 11/03/2021 16:32  ? ?CT Cervical Spine Wo  Contrast ? ?Result Date: 11/03/2021 ?CLINICAL DATA:  Head trauma.  Seizure.  History of glioblastoma. EXAM: CT HEAD WITHOUT CONTRAST CT CERVICAL SPINE WITHOUT CONTRAST TECHNIQUE: Multidetector CT imaging of the head and cerv

## 2021-11-08 ENCOUNTER — Other Ambulatory Visit: Payer: Self-pay

## 2021-11-08 ENCOUNTER — Encounter: Payer: Self-pay | Admitting: Internal Medicine

## 2021-11-08 ENCOUNTER — Telehealth: Payer: Self-pay

## 2021-11-08 ENCOUNTER — Ambulatory Visit
Admission: RE | Admit: 2021-11-08 | Discharge: 2021-11-08 | Disposition: A | Discharge status: Home / Self Care | Payer: Medicare Other | Source: Ambulatory Visit | Attending: Radiation Oncology | Admitting: Radiation Oncology

## 2021-11-08 ENCOUNTER — Other Ambulatory Visit: Payer: Self-pay | Admitting: *Deleted

## 2021-11-08 DIAGNOSIS — Z510 Encounter for antineoplastic radiation therapy: Secondary | ICD-10-CM | POA: Diagnosis not present

## 2021-11-08 MED ORDER — OXYCODONE HCL 5 MG PO TABS: 5.0000 mg | ORAL_TABLET | ORAL | 0 refills | Status: DC | PRN

## 2021-11-08 MED ORDER — DEXAMETHASONE 4 MG PO TABS: 4.0000 mg | ORAL_TABLET | Freq: Two times a day (BID) | ORAL | 0 refills | Status: AC

## 2021-11-08 MED ORDER — TRAZODONE HCL 50 MG PO TABS: 25.0000 mg | ORAL_TABLET | Freq: Every evening | ORAL | 2 refills | Status: AC | PRN

## 2021-11-08 NOTE — Telephone Encounter (Signed)
Left message informing patient, oxy, dex and sleep aid all sent to [facility] as requested. Employee pharmacy confirmed order has been cancelled. Informed patient to call with any other concerns.  ?

## 2021-11-11 ENCOUNTER — Inpatient Hospital Stay: Payer: Medicare Other | Admitting: Pharmacist

## 2021-11-11 ENCOUNTER — Telehealth: Payer: Self-pay | Admitting: Pharmacist

## 2021-11-11 ENCOUNTER — Ambulatory Visit
Admission: RE | Admit: 2021-11-11 | Discharge: 2021-11-11 | Disposition: A | Discharge status: Home / Self Care | Payer: Medicare Other | Source: Ambulatory Visit | Attending: Radiation Oncology | Admitting: Radiation Oncology

## 2021-11-11 DIAGNOSIS — Z510 Encounter for antineoplastic radiation therapy: Secondary | ICD-10-CM | POA: Diagnosis not present

## 2021-11-11 NOTE — Telephone Encounter (Signed)
Oral Chemotherapy Pharmacist Encounter  ? ?Patient reported diarrhea at her Friday when she was at her rad onc appt. She was instructed to begin loperamide OTC and stop her stool softener. ? ?Called patient's cousin Tye Maryland to check in on Ms. Achilles Dunk helps to manage the medications for Ms. Pelzel. She stated that Ms. Glazer did stop the stool softner and is now taking loperamide. Her diarrhea is much improved. Tye Maryland reported that Ms. Pharris is taking 2 tablets twice daily. Tye Maryland knows that the loperamide should be decreased if she begins having constipation. ? ?Tye Maryland also mentioned that Ms. Towery reports not sleeping. Josh, NP recently prescribed trazodone  for sleep, but Ms. Desmith read some information online about the trazodone and hesitant to take the medication. Cathy plans to encourage Ms. Hewins to give the trazodone another try. ? ? ?Darl Pikes, PharmD, BCPS, BCOP, CPP ?Hematology/Oncology Clinical Pharmacist ?/DB/AP Oral Chemotherapy Navigation Clinic ?[phone] ? ?11/11/2021 2:27 PM ? ?

## 2021-11-12 ENCOUNTER — Ambulatory Visit
Admission: RE | Admit: 2021-11-12 | Discharge: 2021-11-12 | Disposition: A | Discharge status: Home / Self Care | Payer: Medicare Other | Source: Ambulatory Visit | Attending: Radiation Oncology | Admitting: Radiation Oncology

## 2021-11-12 DIAGNOSIS — Z510 Encounter for antineoplastic radiation therapy: Secondary | ICD-10-CM | POA: Diagnosis not present

## 2021-11-13 ENCOUNTER — Ambulatory Visit
Admission: RE | Admit: 2021-11-13 | Discharge: 2021-11-13 | Disposition: A | Discharge status: Home / Self Care | Payer: Medicare Other | Source: Ambulatory Visit | Attending: Radiation Oncology | Admitting: Radiation Oncology

## 2021-11-13 DIAGNOSIS — Z510 Encounter for antineoplastic radiation therapy: Secondary | ICD-10-CM | POA: Diagnosis not present

## 2021-11-14 ENCOUNTER — Other Ambulatory Visit (HOSPITAL_COMMUNITY): Payer: Self-pay

## 2021-11-14 ENCOUNTER — Ambulatory Visit
Admission: RE | Admit: 2021-11-14 | Discharge: 2021-11-14 | Disposition: A | Discharge status: Home / Self Care | Payer: Medicare Other | Source: Ambulatory Visit | Attending: Radiation Oncology | Admitting: Radiation Oncology

## 2021-11-14 DIAGNOSIS — Z510 Encounter for antineoplastic radiation therapy: Secondary | ICD-10-CM | POA: Diagnosis not present

## 2021-11-15 ENCOUNTER — Ambulatory Visit
Admission: RE | Admit: 2021-11-15 | Discharge: 2021-11-15 | Disposition: A | Discharge status: Home / Self Care | Payer: Medicare Other | Source: Ambulatory Visit | Attending: Radiation Oncology | Admitting: Radiation Oncology

## 2021-11-15 DIAGNOSIS — Z510 Encounter for antineoplastic radiation therapy: Secondary | ICD-10-CM | POA: Diagnosis not present

## 2021-11-18 ENCOUNTER — Ambulatory Visit
Admission: RE | Admit: 2021-11-18 | Discharge: 2021-11-18 | Disposition: A | Discharge status: Home / Self Care | Payer: Medicare Other | Source: Ambulatory Visit | Attending: Radiation Oncology | Admitting: Radiation Oncology

## 2021-11-18 ENCOUNTER — Other Ambulatory Visit (HOSPITAL_COMMUNITY): Payer: Self-pay

## 2021-11-18 DIAGNOSIS — Z510 Encounter for antineoplastic radiation therapy: Secondary | ICD-10-CM | POA: Diagnosis not present

## 2021-11-19 ENCOUNTER — Ambulatory Visit
Admission: RE | Admit: 2021-11-19 | Discharge: 2021-11-19 | Disposition: A | Discharge status: Home / Self Care | Payer: Medicare Other | Source: Ambulatory Visit | Attending: Radiation Oncology | Admitting: Radiation Oncology

## 2021-11-19 DIAGNOSIS — Z510 Encounter for antineoplastic radiation therapy: Secondary | ICD-10-CM | POA: Diagnosis not present

## 2021-11-20 ENCOUNTER — Other Ambulatory Visit: Payer: Self-pay

## 2021-11-20 ENCOUNTER — Ambulatory Visit
Admission: RE | Admit: 2021-11-20 | Discharge: 2021-11-20 | Disposition: A | Discharge status: Home / Self Care | Payer: Medicare Other | Source: Ambulatory Visit | Attending: Radiation Oncology | Admitting: Radiation Oncology

## 2021-11-20 ENCOUNTER — Inpatient Hospital Stay: Payer: Medicare Other

## 2021-11-20 DIAGNOSIS — Z510 Encounter for antineoplastic radiation therapy: Secondary | ICD-10-CM | POA: Diagnosis not present

## 2021-11-20 NOTE — Progress Notes (Signed)
Nutrition Assessment ? ? ?Reason for Assessment:  ? ?Decreased appetite ? ? ?ASSESSMENT:  ?71 year old female with right parietal glioblastoma.  S/p craniotomy at [facility].  Patient currently receiving radiation and temodar.  ? ?Met with patient and daughter after radiation.  Patient reports that her appetite is doing ok.  For breakfast usually had scrambled egg and orange or pimento cheese on 1 slice of bread.  Lunch is sandwich with orange. Dinner is meat and vegetables.  Reports fatigue as main symptom from treatment.  ? ? ?Medications:  ?Decadron, protonix, colace, Fe, Vit B 12, zofran ? ? ?Labs: glucose 123 ? ? ?Anthropometrics:  ? ?Height: 62 inches ?Weight: 138 lb per [facility] ?145 lb on 3/3 ?BMI: 25 ? ?5% weight loss in the last month, significant ? ? ?NUTRITION DIAGNOSIS: Inadequate oral intake related to cancer and cancer related treatment side effects as evidenced by 5% weight loss in the last month and decreased intake ? ? ?INTERVENTION:  ?Discussed high calorie, high protein diet. Handout provided ?Encouraged trying oral nutrition supplement. Samples of ensure complete and orgain shakes provided.  Listing of oral nutrition supplements given to patient as well ?Contact information provided ? ? ?MONITORING, EVALUATION, GOAL: weight trends, intake ? ? ?Next Visit: April 19 after radiation ? ?Nina Johnson B. Zenia Resides, RD, LDN ?Registered Dietitian ?[phone] ? ? ? ? ? ?

## 2021-11-21 ENCOUNTER — Ambulatory Visit
Admission: RE | Admit: 2021-11-21 | Discharge: 2021-11-21 | Disposition: A | Discharge status: Home / Self Care | Payer: Medicare Other | Source: Ambulatory Visit | Attending: Radiation Oncology | Admitting: Radiation Oncology

## 2021-11-21 DIAGNOSIS — Z510 Encounter for antineoplastic radiation therapy: Secondary | ICD-10-CM | POA: Diagnosis not present

## 2021-11-22 ENCOUNTER — Encounter: Payer: Self-pay | Admitting: Internal Medicine

## 2021-11-22 ENCOUNTER — Inpatient Hospital Stay (HOSPITAL_BASED_OUTPATIENT_CLINIC_OR_DEPARTMENT_OTHER): Payer: Medicare Other | Admitting: Internal Medicine

## 2021-11-22 ENCOUNTER — Inpatient Hospital Stay: Payer: Medicare Other

## 2021-11-22 ENCOUNTER — Ambulatory Visit
Admission: RE | Admit: 2021-11-22 | Discharge: 2021-11-22 | Disposition: A | Discharge status: Home / Self Care | Payer: Medicare Other | Source: Ambulatory Visit | Attending: Radiation Oncology | Admitting: Radiation Oncology

## 2021-11-22 VITALS — BP 122/74 | HR 62 | Temp 96.7°F | Resp 16 | Wt 132.7 lb

## 2021-11-22 DIAGNOSIS — R569 Unspecified convulsions: Secondary | ICD-10-CM | POA: Diagnosis not present

## 2021-11-22 DIAGNOSIS — C719 Malignant neoplasm of brain, unspecified: Secondary | ICD-10-CM

## 2021-11-22 DIAGNOSIS — Z510 Encounter for antineoplastic radiation therapy: Secondary | ICD-10-CM | POA: Diagnosis not present

## 2021-11-22 LAB — CBC WITH DIFFERENTIAL/PLATELET
Abs Immature Granulocytes: 0.12 10*3/uL — ABNORMAL HIGH (ref 0.00–0.07)
Basophils Absolute: 0 10*3/uL (ref 0.0–0.1)
Basophils Relative: 0 %
Eosinophils Absolute: 0 10*3/uL (ref 0.0–0.5)
Eosinophils Relative: 0 %
HCT: 32.9 % — ABNORMAL LOW (ref 36.0–46.0)
Hemoglobin: 9.4 g/dL — ABNORMAL LOW (ref 12.0–15.0)
Immature Granulocytes: 1 %
Lymphocytes Relative: 7 %
Lymphs Abs: 0.7 10*3/uL (ref 0.7–4.0)
MCH: 21.6 pg — ABNORMAL LOW (ref 26.0–34.0)
MCHC: 28.6 g/dL — ABNORMAL LOW (ref 30.0–36.0)
MCV: 75.6 fL — ABNORMAL LOW (ref 80.0–100.0)
Monocytes Absolute: 0.7 10*3/uL (ref 0.1–1.0)
Monocytes Relative: 6 %
Neutro Abs: 9.3 10*3/uL — ABNORMAL HIGH (ref 1.7–7.7)
Neutrophils Relative %: 86 %
Platelets: 336 10*3/uL (ref 150–400)
RBC: 4.35 MIL/uL (ref 3.87–5.11)
RDW: 19.9 % — ABNORMAL HIGH (ref 11.5–15.5)
WBC: 10.8 10*3/uL — ABNORMAL HIGH (ref 4.0–10.5)
nRBC: 0 % (ref 0.0–0.2)

## 2021-11-22 LAB — COMPREHENSIVE METABOLIC PANEL
ALT: 22 U/L (ref 0–44)
AST: 15 U/L (ref 15–41)
Albumin: 3.5 g/dL (ref 3.5–5.0)
Alkaline Phosphatase: 80 U/L (ref 38–126)
Anion gap: 8 (ref 5–15)
BUN: 23 mg/dL (ref 8–23)
CO2: 25 mmol/L (ref 22–32)
Calcium: 8.7 mg/dL — ABNORMAL LOW (ref 8.9–10.3)
Chloride: 103 mmol/L (ref 98–111)
Creatinine, Ser: 0.49 mg/dL (ref 0.44–1.00)
GFR, Estimated: >60 mL/min (ref >60)
Glucose, Bld: 108 mg/dL — ABNORMAL HIGH (ref 70–99)
Potassium: 4.9 mmol/L (ref 3.5–5.1)
Sodium: 136 mmol/L (ref 135–145)
Total Bilirubin: 0.3 mg/dL (ref 0.3–1.2)
Total Protein: 6.3 g/dL — ABNORMAL LOW (ref 6.5–8.1)

## 2021-11-22 NOTE — Progress Notes (Signed)
? ?[place] at [address]  ?[place], [place] [address] ?[phone] ? ? ?Interval Evaluation ? ?Date of Service: 11/22/21 ?Patient Name: Nina Johnson ?Patient MRN: [id number] ?Patient DOB: 01-18-1951 ?Provider: Ventura Sellers, MD ? ?Identifying Statement:  ?Nadean Montanaro is a 71 y.o. female with right parietal glioblastoma  ? ?Oncologic History: ?Oncology History  ?Glioblastoma ([place])  ?10/01/2021 Initial Diagnosis  ? Glioblastoma ([place]) ?  ?10/04/2021 Surgery  ? Re-resection at [place] with Dr. Tommi Rumps; path c/w glioblastoma IDHwt ?  ?10/25/2021 -  Chemotherapy  ? Patient is on Treatment Plan : BRAIN GLIOBLASTOMA Radiation Therapy With Concurrent Temozolomide 75 mg/m2 Daily Followed By Sequential Maintenance Temozolomide x 6-12 cycles  ?   ? ? ?Biomarkers: ? ?MGMT Unknown.  ?IDH 1/2 Wild type.  ?EGFR Unknown  ?TERT Unknown  ? ?Interval History: ?Zaylin Pistilli presents today for follow up, now having completed 2 weeks of radiation and Temodar.  No issues with any of the treatments.  She did have a seizure 2 weeks ago with brief overnight admission.  Keppra was increased to $RemoveBefo'1000mg'ZLpqyheJXCx$  twice per day and decadron was started at $RemoveBe'4mg'bIyFcTFgF$  twice per day.  She acknowledges missing some Keppra doses prior to this seizure event.  Currently at functional baseline. ? ?H+P (10/25/21) Patient presented to medical attention in [facility] in early January this year after first ever seizure.  CNS imaging demonstrated large R parietal mass; she underwent initial resection in [place], where path demonstrated glioblastoma.  She then went to [facility] for additional evaluation, where she subsequently underwent repeat craniotomy with Dr. Tommi Rumps given large amount of residual disease.  Since surgery, she has no specific complaints aside from fatigue.  She remains active, functionally independent, without recurrence of seizures. She would like to obtain radiation and chemotherapy treatments close  to home, thus the referral here today. ? ?Medications: ?Current Outpatient Medications on File Prior to Visit  ?Medication Sig Dispense Refill  ? acetaminophen (TYLENOL) 650 MG CR tablet Take 650 mg by mouth every 8 (eight) hours as needed for pain.    ? dexamethasone (DECADRON) 4 MG tablet Take 1 tablet (4 mg total) by mouth 2 (two) times daily with a meal. 60 tablet 0  ? DOCUSATE SODIUM PO Take 1 tablet by mouth every other day.    ? Iron Polysacch Cmplx-B12-FA (POLY-IRON 150 FORTE) 150-0.025-1 MG CAPS Take 1 capsule (150 mg) by mouth daily. 30 capsule 0  ? levETIRAcetam (KEPPRA) 1000 MG tablet Take 1 tablet (1,000 mg total) by mouth 2 (two) times daily. 60 tablet 0  ? ondansetron (ZOFRAN) 8 MG tablet Take 1 tablet (8 mg total) by mouth 2 (two) times daily as needed (nausea and vomiting). May take 30-60 minutes prior to Temodar administration if nausea/vomiting occurs. 30 tablet 1  ? pantoprazole (PROTONIX) 40 MG tablet Take 40 mg by mouth daily.    ? temozolomide (TEMODAR) 100 MG capsule Take 1 capsule (100 mg total) by mouth daily. May take on an empty stomach to decrease nausea & vomiting. 42 capsule 0  ? temozolomide (TEMODAR) 20 MG capsule Take 1 capsule (20 mg total) by mouth daily. May take on an empty stomach to decrease nausea & vomiting. 42 capsule 0  ? traZODone (DESYREL) 50 MG tablet Take 0.5-1 tablets (25-50 mg total) by mouth at bedtime as needed for sleep. 30 tablet 2  ? oxyCODONE (OXY IR/ROXICODONE) 5 MG immediate release tablet Take 1 tablet (5 mg total) by  mouth every 4 (four) hours as needed for moderate pain. (Patient not taking: Reported on 11/22/2021) 30 tablet 0  ? ?No current facility-administered medications on file prior to visit.  ? ? ?Allergies: No Known Allergies ?Past Medical History:  ?Past Medical History:  ?Diagnosis Date  ? Dehiscence of closure of skull or craniotomy 10/04/2021  ? Dehiscence of closure of skull or craniotomy 08/30/2021  ? Glioblastoma ([place])   ? ?Past Surgical  History:  ?Social History:  ?Social History  ? ?Socioeconomic History  ? Marital status: Widowed  ?  Spouse name: Not on file  ? Number of children: Not on file  ? Years of education: Not on file  ? Highest education level: Not on file  ?Occupational History  ? Not on file  ?Tobacco Use  ? Smoking status: Never  ? Smokeless tobacco: Never  ?Vaping Use  ? Vaping Use: Never used  ?Substance and Sexual Activity  ? Alcohol use: Never  ? Drug use: Never  ? Sexual activity: Not Currently  ?Other Topics Concern  ? Not on file  ?Social History Narrative  ? Not on file  ? ?Social Determinants of Health  ? ?Financial Resource Strain: Not on file  ?Food Insecurity: Not on file  ?Transportation Needs: Not on file  ?Physical Activity: Not on file  ?Stress: Not on file  ?Social Connections: Not on file  ?Intimate Partner Violence: Not on file  ? ?Family History:  ?Family History  ?Problem Relation Age of Onset  ? Cancer Other   ? Seizures Child   ? ? ?Review of Systems: ?Constitutional: Doesn't report fevers, chills or abnormal weight loss ?Eyes: Doesn't report blurriness of vision ?Ears, nose, mouth, throat, and face: Doesn't report sore throat ?Respiratory: Doesn't report cough, dyspnea or wheezes ?Cardiovascular: Doesn't report palpitation, chest discomfort  ?Gastrointestinal:  Doesn't report nausea, constipation, diarrhea ?GU: Doesn't report incontinence ?Skin: Doesn't report skin rashes ?Neurological: Per HPI ?Musculoskeletal: Doesn't report joint pain ?Behavioral/Psych: Doesn't report anxiety ? ?Physical Exam: ?Wt Readings from Last 3 Encounters:  ?11/22/21 132 lb 11.2 oz (60.2 kg)  ?11/03/21 140 lb 10.5 oz (63.8 kg)  ?10/25/21 145 lb 1.6 oz (65.8 kg)  ? ?Temp Readings from Last 3 Encounters:  ?11/22/21 (!) 96.7 ?F (35.9 ?C)  ?11/07/21 98.7 ?F (37.1 ?C) (Tympanic)  ?11/05/21 99.2 ?F (37.3 ?C) (Oral)  ? ?BP Readings from Last 3 Encounters:  ?11/22/21 122/74  ?11/07/21 111/68  ?11/05/21 117/60  ? ?Pulse Readings from Last 3  Encounters:  ?11/22/21 62  ?11/07/21 91  ?11/05/21 99  ? ? ?KPS: 90. ?General: Alert, cooperative, pleasant, in no acute distress ?Head: Normal ?EENT: No conjunctival injection or scleral icterus.  ?Lungs: Resp effort normal ?Cardiac: Regular rate ?Abdomen: Non-distended abdomen ?Skin: No rashes cyanosis or petechiae. ?Extremities: No clubbing or edema ? ?Neurologic Exam: ?Mental Status: Awake, alert, attentive to examiner. Oriented to self and environment. Language is fluent with intact comprehension.  ?Cranial Nerves: Visual acuity is grossly normal. Visual fields are full. Extra-ocular movements intact. No ptosis. Face is symmetric ?Motor: Tone and bulk are normal. Power is full in both arms and legs. Reflexes are symmetric, no pathologic reflexes present.  ?Sensory: Intact to light touch ?Gait: Normal. ? ? ?Labs: ?I have reviewed the data as listed ?   ?Component Value Date/Time  ? NA 136 11/22/2021 0921  ? K 4.9 11/22/2021 0921  ? CL 103 11/22/2021 0921  ? CO2 25 11/22/2021 0921  ? GLUCOSE 108 (H) 11/22/2021 1607  ?  BUN 23 11/22/2021 0921  ? CREATININE 0.49 11/22/2021 0921  ? CALCIUM 8.7 (L) 11/22/2021 5501  ? PROT 6.3 (L) 11/22/2021 5868  ? ALBUMIN 3.5 11/22/2021 0921  ? AST 15 11/22/2021 0921  ? ALT 22 11/22/2021 0921  ? ALKPHOS 80 11/22/2021 0921  ? BILITOT 0.3 11/22/2021 0921  ? GFRNONAA >60 11/22/2021 0921  ? ?Lab Results  ?Component Value Date  ? WBC 10.8 (H) 11/22/2021  ? NEUTROABS 9.3 (H) 11/22/2021  ? HGB 9.4 (L) 11/22/2021  ? HCT 32.9 (L) 11/22/2021  ? MCV 75.6 (L) 11/22/2021  ? PLT 336 11/22/2021  ? ? ?Imaging: ? ?CT HEAD WO CONTRAST (5MM) ? ?Result Date: 11/03/2021 ?CLINICAL DATA:  Head trauma.  Seizure.  History of glioblastoma. EXAM: CT HEAD WITHOUT CONTRAST CT CERVICAL SPINE WITHOUT CONTRAST TECHNIQUE: Multidetector CT imaging of the head and cervical spine was performed following the standard protocol without intravenous contrast. Multiplanar CT image reconstructions of the cervical spine were  also generated. RADIATION DOSE REDUCTION: This exam was performed according to the departmental dose-optimization program which includes automated exposure control, adjustment of the mA and/or kV according to

## 2021-11-22 NOTE — Progress Notes (Signed)
Patient reports that she was prescribed Protonix when discharged from [facility] and needs a refill.   ? ?Also needs a refill on Keppra (medication pended for MD review). ? ?She is on her third week of Temozolomide 120 mg.   ? ?Patient has an 8 lb wt loss since last documented wt on 11/03/21. ?

## 2021-11-25 ENCOUNTER — Ambulatory Visit
Admission: RE | Admit: 2021-11-25 | Discharge: 2021-11-25 | Disposition: A | Discharge status: Home / Self Care | Payer: Medicare Other | Source: Ambulatory Visit | Attending: Radiation Oncology | Admitting: Radiation Oncology

## 2021-11-25 DIAGNOSIS — R52 Pain, unspecified: Secondary | ICD-10-CM | POA: Insufficient documentation

## 2021-11-25 DIAGNOSIS — C713 Malignant neoplasm of parietal lobe: Secondary | ICD-10-CM | POA: Insufficient documentation

## 2021-11-25 DIAGNOSIS — G40909 Epilepsy, unspecified, not intractable, without status epilepticus: Secondary | ICD-10-CM | POA: Diagnosis not present

## 2021-11-25 DIAGNOSIS — Z510 Encounter for antineoplastic radiation therapy: Secondary | ICD-10-CM | POA: Diagnosis present

## 2021-11-25 DIAGNOSIS — G4700 Insomnia, unspecified: Secondary | ICD-10-CM | POA: Insufficient documentation

## 2021-11-26 ENCOUNTER — Other Ambulatory Visit: Payer: Self-pay | Admitting: *Deleted

## 2021-11-26 ENCOUNTER — Ambulatory Visit
Admission: RE | Admit: 2021-11-26 | Discharge: 2021-11-26 | Disposition: A | Discharge status: Home / Self Care | Payer: Medicare Other | Source: Ambulatory Visit | Attending: Radiation Oncology | Admitting: Radiation Oncology

## 2021-11-26 DIAGNOSIS — Z510 Encounter for antineoplastic radiation therapy: Secondary | ICD-10-CM | POA: Diagnosis not present

## 2021-11-26 MED ORDER — LANSOPRAZOLE 30 MG PO CPDR: 30.0000 mg | DELAYED_RELEASE_CAPSULE | Freq: Every day | ORAL | 4 refills | Status: AC

## 2021-11-27 ENCOUNTER — Ambulatory Visit
Admission: RE | Admit: 2021-11-27 | Discharge: 2021-11-27 | Disposition: A | Discharge status: Home / Self Care | Payer: Medicare Other | Source: Ambulatory Visit | Attending: Radiation Oncology | Admitting: Radiation Oncology

## 2021-11-27 DIAGNOSIS — Z510 Encounter for antineoplastic radiation therapy: Secondary | ICD-10-CM | POA: Diagnosis not present

## 2021-11-28 ENCOUNTER — Ambulatory Visit
Admission: RE | Admit: 2021-11-28 | Discharge: 2021-11-28 | Disposition: A | Discharge status: Home / Self Care | Payer: Medicare Other | Source: Ambulatory Visit | Attending: Radiation Oncology | Admitting: Radiation Oncology

## 2021-11-28 DIAGNOSIS — Z510 Encounter for antineoplastic radiation therapy: Secondary | ICD-10-CM | POA: Diagnosis not present

## 2021-11-29 ENCOUNTER — Ambulatory Visit
Admission: RE | Admit: 2021-11-29 | Discharge: 2021-11-29 | Disposition: A | Discharge status: Home / Self Care | Payer: Medicare Other | Source: Ambulatory Visit | Attending: Radiation Oncology | Admitting: Radiation Oncology

## 2021-11-29 DIAGNOSIS — Z510 Encounter for antineoplastic radiation therapy: Secondary | ICD-10-CM | POA: Diagnosis not present

## 2021-12-02 ENCOUNTER — Ambulatory Visit
Admission: RE | Admit: 2021-12-02 | Discharge: 2021-12-02 | Disposition: A | Discharge status: Home / Self Care | Payer: Medicare Other | Source: Ambulatory Visit | Attending: Radiation Oncology | Admitting: Radiation Oncology

## 2021-12-02 DIAGNOSIS — Z510 Encounter for antineoplastic radiation therapy: Secondary | ICD-10-CM | POA: Diagnosis not present

## 2021-12-03 ENCOUNTER — Ambulatory Visit
Admission: RE | Admit: 2021-12-03 | Discharge: 2021-12-03 | Disposition: A | Discharge status: Home / Self Care | Payer: Medicare Other | Source: Ambulatory Visit | Attending: Radiation Oncology | Admitting: Radiation Oncology

## 2021-12-03 DIAGNOSIS — Z510 Encounter for antineoplastic radiation therapy: Secondary | ICD-10-CM | POA: Diagnosis not present

## 2021-12-04 ENCOUNTER — Ambulatory Visit
Admission: RE | Admit: 2021-12-04 | Discharge: 2021-12-04 | Disposition: A | Discharge status: Home / Self Care | Payer: Medicare Other | Source: Ambulatory Visit | Attending: Radiation Oncology | Admitting: Radiation Oncology

## 2021-12-04 DIAGNOSIS — Z510 Encounter for antineoplastic radiation therapy: Secondary | ICD-10-CM | POA: Diagnosis not present

## 2021-12-05 ENCOUNTER — Ambulatory Visit
Admission: RE | Admit: 2021-12-05 | Discharge: 2021-12-05 | Disposition: A | Discharge status: Home / Self Care | Payer: Medicare Other | Source: Ambulatory Visit | Attending: Radiation Oncology | Admitting: Radiation Oncology

## 2021-12-05 DIAGNOSIS — Z510 Encounter for antineoplastic radiation therapy: Secondary | ICD-10-CM | POA: Diagnosis not present

## 2021-12-06 ENCOUNTER — Inpatient Hospital Stay (HOSPITAL_BASED_OUTPATIENT_CLINIC_OR_DEPARTMENT_OTHER): Payer: Medicare Other | Admitting: Internal Medicine

## 2021-12-06 ENCOUNTER — Inpatient Hospital Stay: Payer: Medicare Other

## 2021-12-06 ENCOUNTER — Ambulatory Visit
Admission: RE | Admit: 2021-12-06 | Discharge: 2021-12-06 | Disposition: A | Discharge status: Home / Self Care | Payer: Medicare Other | Source: Ambulatory Visit | Attending: Radiation Oncology | Admitting: Radiation Oncology

## 2021-12-06 VITALS — BP 118/66 | HR 92 | Resp 16 | Ht 62.0 in | Wt 139.3 lb

## 2021-12-06 DIAGNOSIS — R569 Unspecified convulsions: Secondary | ICD-10-CM

## 2021-12-06 DIAGNOSIS — C719 Malignant neoplasm of brain, unspecified: Secondary | ICD-10-CM

## 2021-12-06 DIAGNOSIS — C713 Malignant neoplasm of parietal lobe: Secondary | ICD-10-CM | POA: Insufficient documentation

## 2021-12-06 DIAGNOSIS — D696 Thrombocytopenia, unspecified: Secondary | ICD-10-CM | POA: Insufficient documentation

## 2021-12-06 DIAGNOSIS — Z510 Encounter for antineoplastic radiation therapy: Secondary | ICD-10-CM | POA: Diagnosis not present

## 2021-12-06 LAB — COMPREHENSIVE METABOLIC PANEL
ALT: 29 U/L (ref 0–44)
AST: 23 U/L (ref 15–41)
Albumin: 3.2 g/dL — ABNORMAL LOW (ref 3.5–5.0)
Alkaline Phosphatase: 66 U/L (ref 38–126)
Anion gap: 8 (ref 5–15)
BUN: 16 mg/dL (ref 8–23)
CO2: 21 mmol/L — ABNORMAL LOW (ref 22–32)
Calcium: 8.3 mg/dL — ABNORMAL LOW (ref 8.9–10.3)
Chloride: 106 mmol/L (ref 98–111)
Creatinine, Ser: 0.55 mg/dL (ref 0.44–1.00)
GFR, Estimated: >60 mL/min (ref >60)
Glucose, Bld: 103 mg/dL — ABNORMAL HIGH (ref 70–99)
Potassium: 3.8 mmol/L (ref 3.5–5.1)
Sodium: 135 mmol/L (ref 135–145)
Total Bilirubin: 0.4 mg/dL (ref 0.3–1.2)
Total Protein: 6.2 g/dL — ABNORMAL LOW (ref 6.5–8.1)

## 2021-12-06 LAB — CBC WITH DIFFERENTIAL/PLATELET
Abs Immature Granulocytes: 0.03 10*3/uL (ref 0.00–0.07)
Basophils Absolute: 0 10*3/uL (ref 0.0–0.1)
Basophils Relative: 0 %
Eosinophils Absolute: 0.1 10*3/uL (ref 0.0–0.5)
Eosinophils Relative: 1 %
HCT: 30.5 % — ABNORMAL LOW (ref 36.0–46.0)
Hemoglobin: 8.8 g/dL — ABNORMAL LOW (ref 12.0–15.0)
Immature Granulocytes: 1 %
Lymphocytes Relative: 16 %
Lymphs Abs: 0.8 10*3/uL (ref 0.7–4.0)
MCH: 22.7 pg — ABNORMAL LOW (ref 26.0–34.0)
MCHC: 28.9 g/dL — ABNORMAL LOW (ref 30.0–36.0)
MCV: 78.6 fL — ABNORMAL LOW (ref 80.0–100.0)
Monocytes Absolute: 0.3 10*3/uL (ref 0.1–1.0)
Monocytes Relative: 7 %
Neutro Abs: 3.5 10*3/uL (ref 1.7–7.7)
Neutrophils Relative %: 75 %
Platelets: 77 10*3/uL — ABNORMAL LOW (ref 150–400)
RBC: 3.88 MIL/uL (ref 3.87–5.11)
RDW: 24.3 % — ABNORMAL HIGH (ref 11.5–15.5)
WBC: 4.7 10*3/uL (ref 4.0–10.5)
nRBC: 0 % (ref 0.0–0.2)

## 2021-12-06 MED ORDER — POLY-IRON 150 FORTE 150-25-1 MG-MCG-MG PO CAPS: 150.0000 mg | ORAL_CAPSULE | Freq: Every day | ORAL | 2 refills | Status: DC

## 2021-12-06 MED ORDER — POLY-IRON 150 FORTE 150-25-1 MG-MCG-MG PO CAPS: 150.0000 mg | ORAL_CAPSULE | Freq: Every day | ORAL | 0 refills | Status: DC

## 2021-12-06 NOTE — Progress Notes (Signed)
? ?[name] at [address]  ?[place], [place] [address] ?[phone] ? ? ?Interval Evaluation ? ?Date of Service: 12/06/21 ?Patient Name: Nina Johnson ?Patient MRN: [id number] ?Patient DOB: 05-20-1951 ?Provider: Ventura Sellers, MD ? ?Identifying Statement:  ?Nina Johnson is a 71 y.o. female with right parietal glioblastoma  ? ?Oncologic History: ?Oncology History  ?Glioblastoma ([place])  ?10/01/2021 Initial Diagnosis  ? Glioblastoma ([place]) ?  ?10/04/2021 Surgery  ? Re-resection at [place] with Dr. Tommi Rumps; path c/w glioblastoma IDHwt ?  ?10/25/2021 -  Chemotherapy  ? Patient is on Treatment Plan : BRAIN GLIOBLASTOMA Radiation Therapy With Concurrent Temozolomide 75 mg/m2 Daily Followed By Sequential Maintenance Temozolomide x 6-12 cycles  ? ?  ?  ? ? ?Biomarkers: ? ?MGMT Unknown.  ?IDH 1/2 Wild type.  ?EGFR Unknown  ?TERT Unknown  ? ?Interval History: ?Nina Johnson presents today for follow up, now having completed 4 weeks of radiation and Temodar.  No issues with any of the treatments.  No further seizures.  Does complain of diarrhea the past couple of days, however. Decadron currently at $RemoveBefo'2mg'QnRbGQLYhRH$  daily.  Mild fatigue noted. ? ?H+P (10/25/21) Patient presented to medical attention in [facility] in early January this year after first ever seizure.  CNS imaging demonstrated large R parietal mass; she underwent initial resection in [place], where path demonstrated glioblastoma.  She then went to [facility] for additional evaluation, where she subsequently underwent repeat craniotomy with Dr. Tommi Rumps given large amount of residual disease.  Since surgery, she has no specific complaints aside from fatigue.  She remains active, functionally independent, without recurrence of seizures. She would like to obtain radiation and chemotherapy treatments close to home, thus the referral here today. ? ?Medications: ?Current Outpatient Medications on File Prior to Visit  ?Medication Sig  Dispense Refill  ? acetaminophen (TYLENOL) 650 MG CR tablet Take 650 mg by mouth every 8 (eight) hours as needed for pain.    ? dexamethasone (DECADRON) 4 MG tablet Take 1 tablet (4 mg total) by mouth 2 (two) times daily with a meal. 60 tablet 0  ? DOCUSATE SODIUM PO Take 1 tablet by mouth every other day.    ? Iron Polysacch Cmplx-B12-FA (POLY-IRON 150 FORTE) 150-0.025-1 MG CAPS Take 1 capsule (150 mg) by mouth daily. 30 capsule 0  ? lansoprazole (PREVACID) 30 MG capsule Take 1 capsule (30 mg total) by mouth daily at 12 noon. 90 capsule 4  ? levETIRAcetam (KEPPRA) 1000 MG tablet Take 1 tablet (1,000 mg total) by mouth 2 (two) times daily. 60 tablet 0  ? ondansetron (ZOFRAN) 8 MG tablet Take 1 tablet (8 mg total) by mouth 2 (two) times daily as needed (nausea and vomiting). May take 30-60 minutes prior to Temodar administration if nausea/vomiting occurs. 30 tablet 1  ? oxyCODONE (OXY IR/ROXICODONE) 5 MG immediate release tablet Take 1 tablet (5 mg total) by mouth every 4 (four) hours as needed for moderate pain. (Patient not taking: Reported on 11/22/2021) 30 tablet 0  ? pantoprazole (PROTONIX) 40 MG tablet Take 40 mg by mouth daily.    ? temozolomide (TEMODAR) 100 MG capsule Take 1 capsule (100 mg total) by mouth daily. May take on an empty stomach to decrease nausea & vomiting. 42 capsule 0  ? temozolomide (TEMODAR) 20 MG capsule Take 1 capsule (20 mg total) by mouth daily. May take on an empty stomach to decrease nausea & vomiting. 42 capsule 0  ? traZODone (DESYREL) 50  MG tablet Take 0.5-1 tablets (25-50 mg total) by mouth at bedtime as needed for sleep. 30 tablet 2  ? ?No current facility-administered medications on file prior to visit.  ? ? ?Allergies: No Known Allergies ?Past Medical History:  ?Past Medical History:  ?Diagnosis Date  ? Dehiscence of closure of skull or craniotomy 10/04/2021  ? Dehiscence of closure of skull or craniotomy 08/30/2021  ? Glioblastoma ([place])   ? ?Past Surgical History:  ?Social  History:  ?Social History  ? ?Socioeconomic History  ? Marital status: Widowed  ?  Spouse name: Not on file  ? Number of children: Not on file  ? Years of education: Not on file  ? Highest education level: Not on file  ?Occupational History  ? Not on file  ?Tobacco Use  ? Smoking status: Never  ? Smokeless tobacco: Never  ?Vaping Use  ? Vaping Use: Never used  ?Substance and Sexual Activity  ? Alcohol use: Never  ? Drug use: Never  ? Sexual activity: Not Currently  ?Other Topics Concern  ? Not on file  ?Social History Narrative  ? Not on file  ? ?Social Determinants of Health  ? ?Financial Resource Strain: Not on file  ?Food Insecurity: Not on file  ?Transportation Needs: Not on file  ?Physical Activity: Not on file  ?Stress: Not on file  ?Social Connections: Not on file  ?Intimate Partner Violence: Not on file  ? ?Family History:  ?Family History  ?Problem Relation Age of Onset  ? Cancer Other   ? Seizures Child   ? ? ?Review of Systems: ?Constitutional: Doesn't report fevers, chills or abnormal weight loss ?Eyes: Doesn't report blurriness of vision ?Ears, nose, mouth, throat, and face: Doesn't report sore throat ?Respiratory: Doesn't report cough, dyspnea or wheezes ?Cardiovascular: Doesn't report palpitation, chest discomfort  ?Gastrointestinal:  Doesn't report nausea, constipation, diarrhea ?GU: Doesn't report incontinence ?Skin: Doesn't report skin rashes ?Neurological: Per HPI ?Musculoskeletal: Doesn't report joint pain ?Behavioral/Psych: Doesn't report anxiety ? ?Physical Exam: ?Wt Readings from Last 3 Encounters:  ?11/22/21 132 lb 11.2 oz (60.2 kg)  ?11/03/21 140 lb 10.5 oz (63.8 kg)  ?10/25/21 145 lb 1.6 oz (65.8 kg)  ? ?Temp Readings from Last 3 Encounters:  ?11/22/21 (!) 96.7 ?F (35.9 ?C)  ?11/07/21 98.7 ?F (37.1 ?C) (Tympanic)  ?11/05/21 99.2 ?F (37.3 ?C) (Oral)  ? ?BP Readings from Last 3 Encounters:  ?11/22/21 122/74  ?11/07/21 111/68  ?11/05/21 117/60  ? ?Pulse Readings from Last 3 Encounters:   ?11/22/21 62  ?11/07/21 91  ?11/05/21 99  ? ? ?KPS: 90. ?General: Alert, cooperative, pleasant, in no acute distress ?Head: Normal ?EENT: No conjunctival injection or scleral icterus.  ?Lungs: Resp effort normal ?Cardiac: Regular rate ?Abdomen: Non-distended abdomen ?Skin: No rashes cyanosis or petechiae. ?Extremities: No clubbing or edema ? ?Neurologic Exam: ?Mental Status: Awake, alert, attentive to examiner. Oriented to self and environment. Language is fluent with intact comprehension.  ?Cranial Nerves: Visual acuity is grossly normal. Visual fields are full. Extra-ocular movements intact. No ptosis. Face is symmetric ?Motor: Tone and bulk are normal. Power is full in both arms and legs. Reflexes are symmetric, no pathologic reflexes present.  ?Sensory: Intact to light touch ?Gait: Normal. ? ? ?Labs: ?I have reviewed the data as listed ?   ?Component Value Date/Time  ? NA 136 11/22/2021 0921  ? K 4.9 11/22/2021 0921  ? CL 103 11/22/2021 0921  ? CO2 25 11/22/2021 0921  ? GLUCOSE 108 (H) 11/22/2021 0300  ?  BUN 23 11/22/2021 0921  ? CREATININE 0.49 11/22/2021 0921  ? CALCIUM 8.7 (L) 11/22/2021 1517  ? PROT 6.3 (L) 11/22/2021 6160  ? ALBUMIN 3.5 11/22/2021 0921  ? AST 15 11/22/2021 0921  ? ALT 22 11/22/2021 0921  ? ALKPHOS 80 11/22/2021 0921  ? BILITOT 0.3 11/22/2021 0921  ? GFRNONAA >60 11/22/2021 0921  ? ?Lab Results  ?Component Value Date  ? WBC 10.8 (H) 11/22/2021  ? NEUTROABS 9.3 (H) 11/22/2021  ? HGB 9.4 (L) 11/22/2021  ? HCT 32.9 (L) 11/22/2021  ? MCV 75.6 (L) 11/22/2021  ? PLT 336 11/22/2021  ? ? ?Assessment/Plan ?Glioblastoma ([place]) ? ?Seizure ([place]) ? ?Nina Johnson is clinically stable today, now having completed 4 weeks of IMRT and Temodar.  Labs are notable for thrombocytopenia today.  We will therefore recommend holding Temodar for the final stretch of IMRT.   ? ?We ultimately recommended proceeding with course of intensity modulated radiation therapy. Radiation will be administered Mon-Fri over 6  weeks. ? ?Chemotherapy should be held for the following: ?? ANC less than 1,000 ?? Platelets less than 100,000 ?? LFT or creatinine greater than 2x ULN ?? If clinical concerns/contraindications develop ? ?Keppra will

## 2021-12-06 NOTE — Progress Notes (Signed)
Pt reports diarrhea releived by immodium.Pt requesting refill of iron. ?

## 2021-12-09 ENCOUNTER — Ambulatory Visit
Admission: RE | Admit: 2021-12-09 | Discharge: 2021-12-09 | Disposition: A | Discharge status: Home / Self Care | Payer: Medicare Other | Source: Ambulatory Visit | Attending: Radiation Oncology | Admitting: Radiation Oncology

## 2021-12-09 DIAGNOSIS — Z510 Encounter for antineoplastic radiation therapy: Secondary | ICD-10-CM | POA: Diagnosis not present

## 2021-12-10 ENCOUNTER — Other Ambulatory Visit: Payer: Self-pay | Admitting: Radiation Therapy

## 2021-12-10 ENCOUNTER — Ambulatory Visit
Admission: RE | Admit: 2021-12-10 | Discharge: 2021-12-10 | Disposition: A | Discharge status: Home / Self Care | Payer: Medicare Other | Source: Ambulatory Visit | Attending: Radiation Oncology | Admitting: Radiation Oncology

## 2021-12-10 ENCOUNTER — Other Ambulatory Visit: Payer: Self-pay

## 2021-12-10 DIAGNOSIS — Z510 Encounter for antineoplastic radiation therapy: Secondary | ICD-10-CM | POA: Diagnosis not present

## 2021-12-10 LAB — RAD ONC ARIA SESSION SUMMARY
Course Elapsed Days: 34
Plan Fractions Treated to Date: 25
Plan Prescribed Dose Per Fraction: 2 Gy
Plan Total Fractions Prescribed: 30
Plan Total Prescribed Dose: 60 Gy
Reference Point Dosage Given to Date: 50 Gy
Reference Point Session Dosage Given: 2 Gy
Session Number: 25

## 2021-12-11 ENCOUNTER — Other Ambulatory Visit: Payer: Self-pay

## 2021-12-11 ENCOUNTER — Ambulatory Visit
Admission: RE | Admit: 2021-12-11 | Discharge: 2021-12-11 | Disposition: A | Discharge status: Home / Self Care | Payer: Medicare Other | Source: Ambulatory Visit | Attending: Radiation Oncology | Admitting: Radiation Oncology

## 2021-12-11 DIAGNOSIS — Z510 Encounter for antineoplastic radiation therapy: Secondary | ICD-10-CM | POA: Diagnosis not present

## 2021-12-11 LAB — RAD ONC ARIA SESSION SUMMARY
Course Elapsed Days: 35
Plan Fractions Treated to Date: 26
Plan Prescribed Dose Per Fraction: 2 Gy
Plan Total Fractions Prescribed: 30
Plan Total Prescribed Dose: 60 Gy
Reference Point Dosage Given to Date: 52 Gy
Reference Point Session Dosage Given: 2 Gy
Session Number: 26

## 2021-12-12 ENCOUNTER — Other Ambulatory Visit: Payer: Self-pay

## 2021-12-12 ENCOUNTER — Ambulatory Visit
Admission: RE | Admit: 2021-12-12 | Discharge: 2021-12-12 | Disposition: A | Discharge status: Home / Self Care | Payer: Medicare Other | Source: Ambulatory Visit | Attending: Radiation Oncology | Admitting: Radiation Oncology

## 2021-12-12 DIAGNOSIS — Z510 Encounter for antineoplastic radiation therapy: Secondary | ICD-10-CM | POA: Diagnosis not present

## 2021-12-12 LAB — RAD ONC ARIA SESSION SUMMARY
Course Elapsed Days: 36
Plan Fractions Treated to Date: 27
Plan Prescribed Dose Per Fraction: 2 Gy
Plan Total Fractions Prescribed: 30
Plan Total Prescribed Dose: 60 Gy
Reference Point Dosage Given to Date: 54 Gy
Reference Point Session Dosage Given: 2 Gy
Session Number: 27

## 2021-12-13 ENCOUNTER — Other Ambulatory Visit: Payer: Self-pay

## 2021-12-13 ENCOUNTER — Ambulatory Visit
Admission: RE | Admit: 2021-12-13 | Discharge: 2021-12-13 | Disposition: A | Discharge status: Home / Self Care | Payer: Medicare Other | Source: Ambulatory Visit | Attending: Radiation Oncology | Admitting: Radiation Oncology

## 2021-12-13 DIAGNOSIS — Z510 Encounter for antineoplastic radiation therapy: Secondary | ICD-10-CM | POA: Diagnosis not present

## 2021-12-13 LAB — RAD ONC ARIA SESSION SUMMARY
Course Elapsed Days: 37
Plan Fractions Treated to Date: 28
Plan Prescribed Dose Per Fraction: 2 Gy
Plan Total Fractions Prescribed: 30
Plan Total Prescribed Dose: 60 Gy
Reference Point Dosage Given to Date: 56 Gy
Reference Point Session Dosage Given: 2 Gy
Session Number: 28

## 2021-12-16 ENCOUNTER — Ambulatory Visit
Admission: RE | Admit: 2021-12-16 | Discharge: 2021-12-16 | Disposition: A | Discharge status: Home / Self Care | Payer: Medicare Other | Source: Ambulatory Visit | Attending: Radiation Oncology | Admitting: Radiation Oncology

## 2021-12-16 ENCOUNTER — Other Ambulatory Visit: Payer: Self-pay

## 2021-12-16 DIAGNOSIS — Z510 Encounter for antineoplastic radiation therapy: Secondary | ICD-10-CM | POA: Diagnosis not present

## 2021-12-16 LAB — RAD ONC ARIA SESSION SUMMARY
Course Elapsed Days: 40
Plan Fractions Treated to Date: 29
Plan Prescribed Dose Per Fraction: 2 Gy
Plan Total Fractions Prescribed: 30
Plan Total Prescribed Dose: 60 Gy
Reference Point Dosage Given to Date: 58 Gy
Reference Point Session Dosage Given: 2 Gy
Session Number: 29

## 2021-12-17 ENCOUNTER — Other Ambulatory Visit: Payer: Self-pay

## 2021-12-17 ENCOUNTER — Ambulatory Visit
Admission: RE | Admit: 2021-12-17 | Discharge: 2021-12-17 | Disposition: A | Discharge status: Home / Self Care | Payer: Medicare Other | Source: Ambulatory Visit | Attending: Radiation Oncology | Admitting: Radiation Oncology

## 2021-12-17 ENCOUNTER — Ambulatory Visit: Payer: Medicare Other

## 2021-12-17 DIAGNOSIS — Z510 Encounter for antineoplastic radiation therapy: Secondary | ICD-10-CM | POA: Diagnosis not present

## 2021-12-17 LAB — RAD ONC ARIA SESSION SUMMARY
Course Elapsed Days: 41
Plan Fractions Treated to Date: 30
Plan Prescribed Dose Per Fraction: 2 Gy
Plan Total Fractions Prescribed: 30
Plan Total Prescribed Dose: 60 Gy
Reference Point Dosage Given to Date: 60 Gy
Reference Point Session Dosage Given: 2 Gy
Session Number: 30

## 2021-12-18 ENCOUNTER — Ambulatory Visit: Payer: Medicare Other

## 2022-01-01 ENCOUNTER — Telehealth: Payer: Self-pay | Admitting: *Deleted

## 2022-01-01 NOTE — Telephone Encounter (Signed)
Patient called requesting a return call regarding her scheduling and not seeing an appointment she is looking for. Please return her call to discuss ?

## 2022-01-02 ENCOUNTER — Encounter: Payer: Self-pay | Admitting: Internal Medicine

## 2022-01-09 ENCOUNTER — Ambulatory Visit
Admission: RE | Admit: 2022-01-09 | Discharge: 2022-01-09 | Disposition: A | Discharge status: Home / Self Care | Payer: Medicare Other | Source: Ambulatory Visit | Attending: Internal Medicine | Admitting: Internal Medicine

## 2022-01-09 DIAGNOSIS — C719 Malignant neoplasm of brain, unspecified: Secondary | ICD-10-CM | POA: Insufficient documentation

## 2022-01-09 IMAGING — MR MR HEAD WO/W CM
15 series · 48 of 48 positions shown · IV contrast (gadavist)
Comparison: [DATE]

CLINICAL DATA: History of glioblastoma. History of craniotomy and
radiotherapy.

EXAM:
MRI HEAD WITHOUT AND WITH CONTRAST
TECHNIQUE: Multiplanar, multiecho pulse sequences of the brain and surrounding
structures were obtained without and with intravenous contrast.
CONTRAST:  6mL GADAVIST GADOBUTROL 1 MMOL/ML IV SOLN

[Series 5: ax dwi_tracew · axial · 3.0mm · 0.65mm/px · z∈[-116,+30]mm · 4 of 46 slices shown]
[im 1/46]
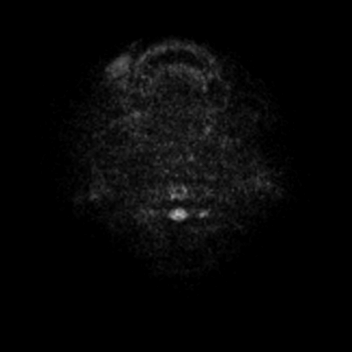
[im 16/46]
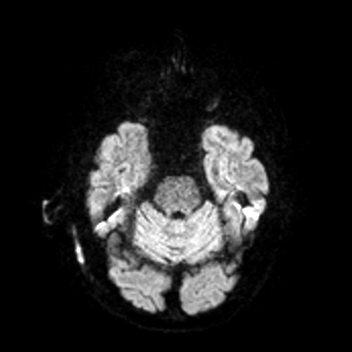
[im 31/46]
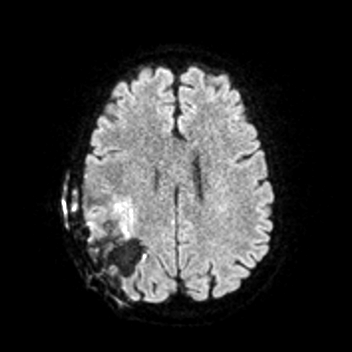
[im 46/46]
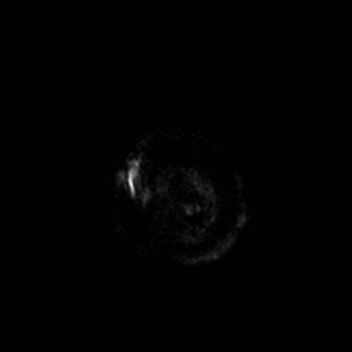

[Series 6: ax dwi_adc · axial · 3.0mm · 0.65mm/px · z∈[-116,+30]mm · 3 of 46 slices shown]
[im 1/46]
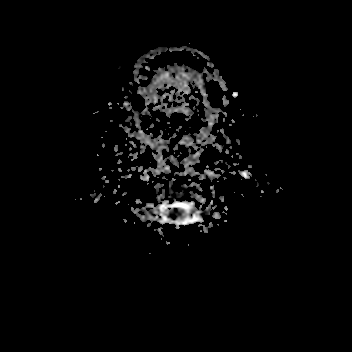
[im 23/46]
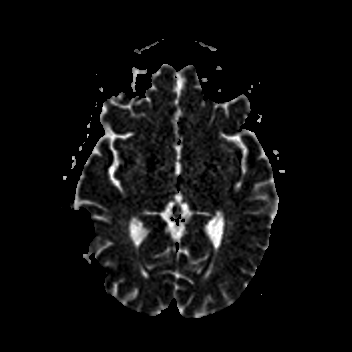
[im 46/46]
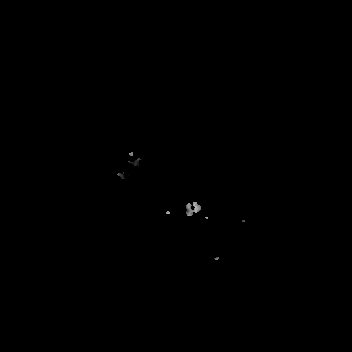

[Series 7: cor dwi_tracew · coronal · 5.0mm · 0.65mm/px · 2 of 36 slices shown]
[im 1/36]
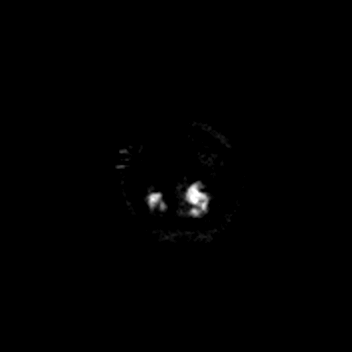
[im 36/36]
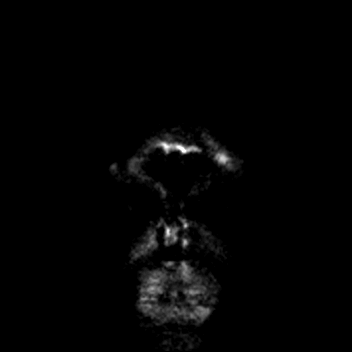

[Series 8: cor dwi_adc · coronal · 5.0mm · 0.65mm/px · 2 of 36 slices shown]
[im 1/36]
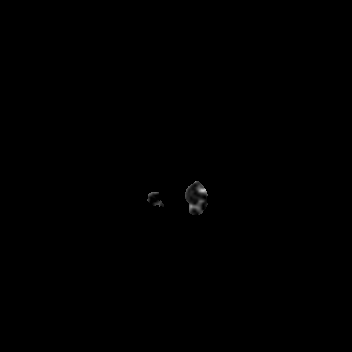
[im 36/36]
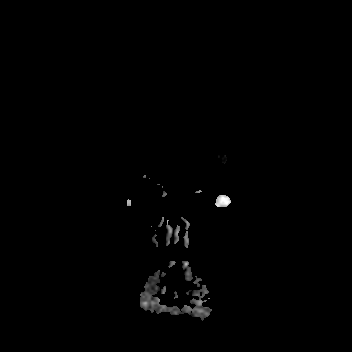

[Series 9: T1 · sagittal · 5.0mm · 0.62mm/px · 1 of 22 slices shown (1 of 2)]
[im 1/22]
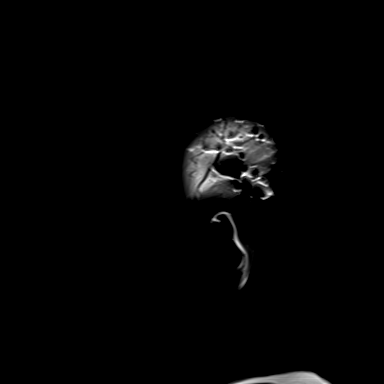

[Series 10: T2 · axial · 5.0mm · 0.53mm/px · 1 of 25 slices shown]
[im 1/25]
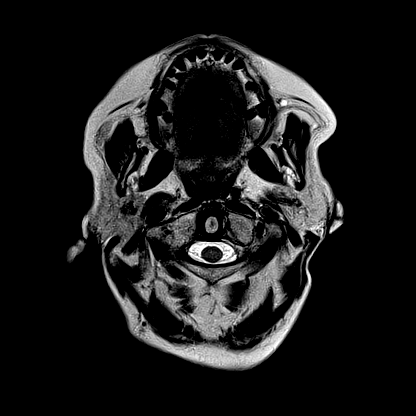

[Series 11: mag_images · axial · 3.0mm · 0.90mm/px · z∈[-127,+47]mm · 3 of 60 slices shown]
[im 1/60]
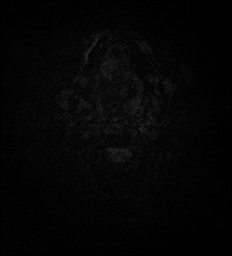
[im 30/60]
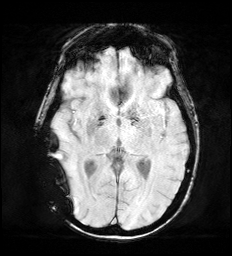
[im 60/60]
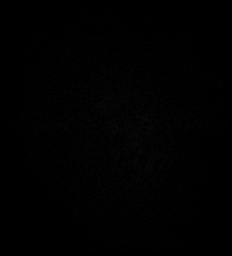

[Series 12: pha_images · axial · 3.0mm · 0.90mm/px · z∈[-127,+44]mm · 3 of 59 slices shown]
[im 1/59]
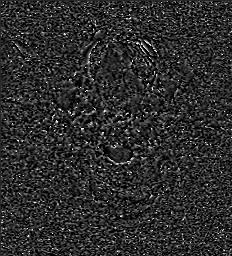
[im 30/59]
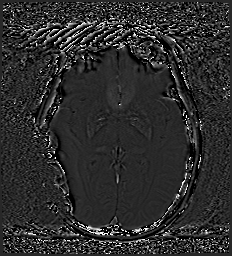
[im 59/59]
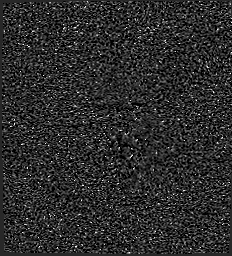

[Series 13: swi_images · axial · 3.0mm · 0.90mm/px · z∈[-127,+47]mm · 3 of 60 slices shown]
[im 1/60]
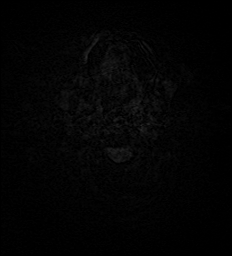
[im 30/60]
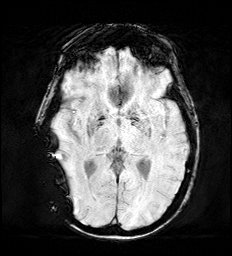
[im 60/60]
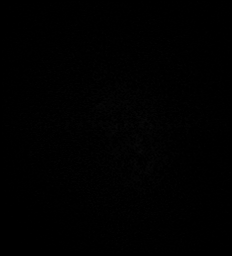

[Series 15: FLAIR · axial · 3.0mm · 0.53mm/px · z∈[-121,+39]mm · 3 of 55 slices shown]
[im 1/55]
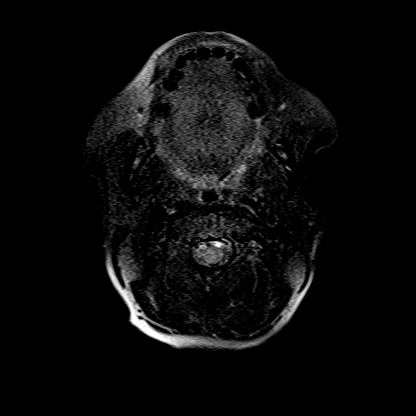
[im 28/55]
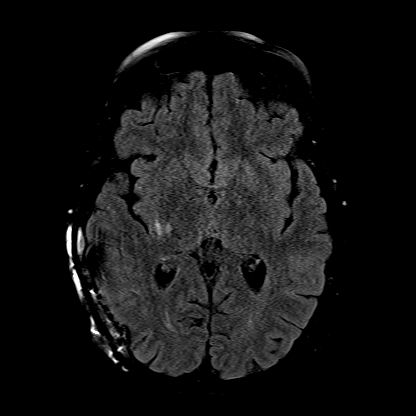
[im 55/55]
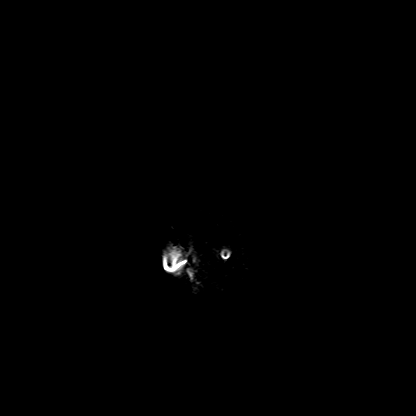

[Series 16: T1 · axial · 1.0mm · 0.98mm/px · z∈[-118,+38]mm · 9 of 160 slices shown (2 of 2)]
[im 1/160]
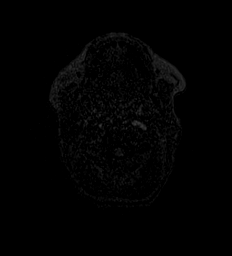
[im 20/160]
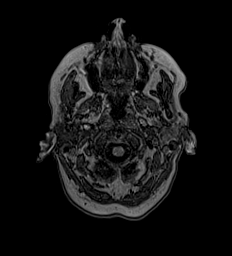
[im 40/160]
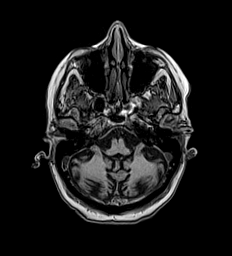
[im 60/160]
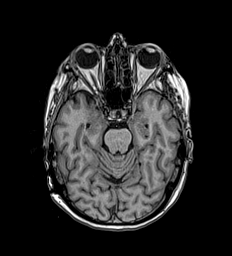
[im 80/160]
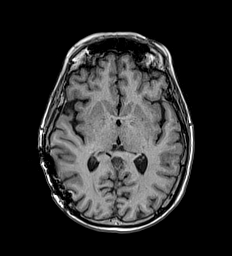
[im 100/160]
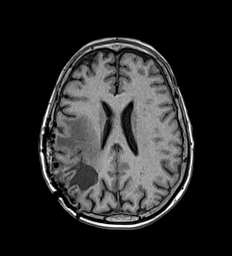
[im 120/160]
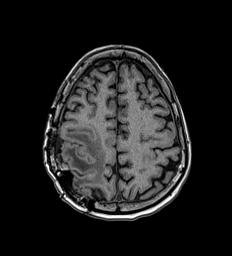
[im 140/160]
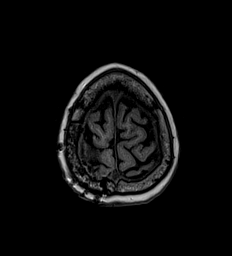
[im 160/160]
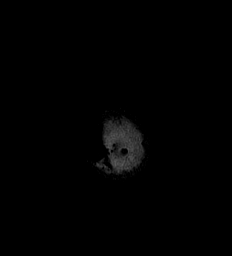

[Series 17: T2 post-contrast · coronal · 5.0mm · 0.57mm/px · 2 of 29 slices shown]
[im 1/29]
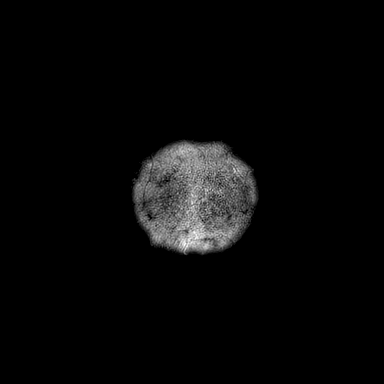
[im 29/29]
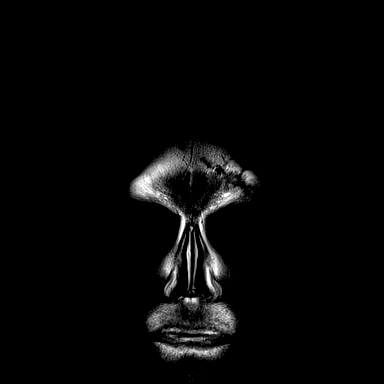

[Series 18: T1 post-contrast · axial · 1.0mm · 0.98mm/px · z∈[-118,+38]mm · 9 of 160 slices shown (1 of 3)]
[im 1/160]
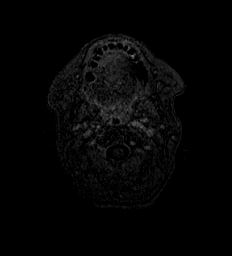
[im 20/160]
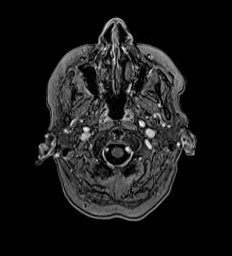
[im 40/160]
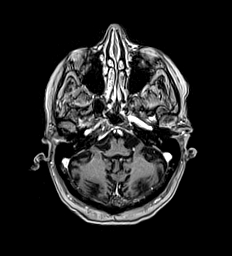
[im 60/160]
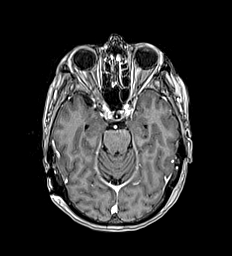
[im 80/160]
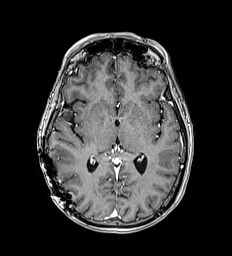
[im 100/160]
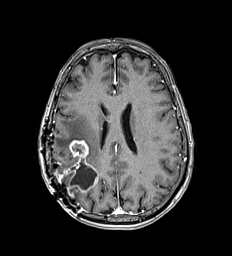
[im 120/160]
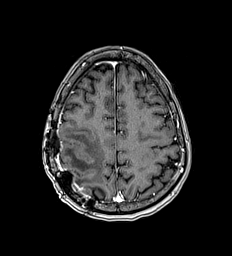
[im 140/160]
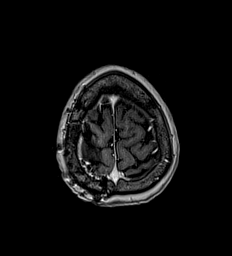
[im 160/160]
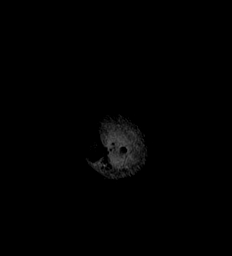

[Series 19: T1 post-contrast · coronal · 5.0mm · 0.57mm/px · 2 of 29 slices shown (2 of 3)]
[im 1/29]
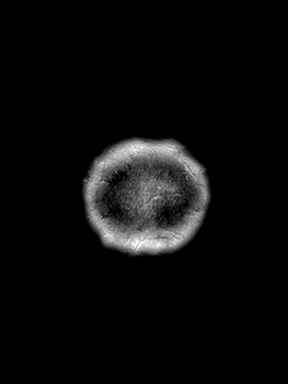
[im 29/29]
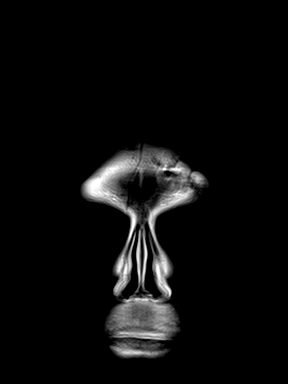

[Series 20: T1 post-contrast · sagittal · 5.0mm · 0.62mm/px · 1 of 22 slices shown (3 of 3)]
[im 1/22]
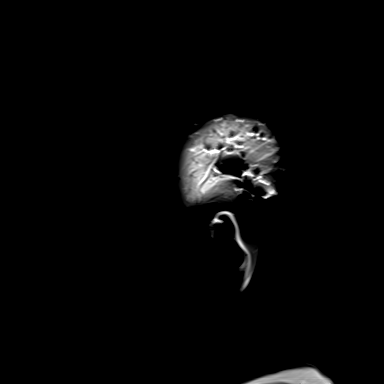

[48 of 48 positions shown; findings below may reference images not displayed]

FINDINGS: Brain: Glioblastoma centered in the right parietal lobe with
resection cavity. Anterior masslike area of dense cellular features
of T2 hypointensity and enhancement with central necrotic
appearance. This area of enhancement measures less than before, with
transverse span measuring 19 mm as compared to 23 mm on coronal
postcontrast acquisition. There is a rim of enhancement around the
resection cavity which is less thick than before. By T1 weighted
imaging, the fluid within the cavity is also more simple. There is
however increased T2 signal especially along the anterior and
superior aspect of the mass.

No acute infarct, acute hemorrhage, hydrocephalus, or collection. No
midline shift.

Cerebellar atrophy.

Vascular: Major flow voids and vascular enhancements are preserved

Skull and upper cervical spine: Extensive artifact from right
craniotomy with history of skull dehiscence

Sinuses/Orbits: Negative
IMPRESSION: Mixed findings of mildly decreased enhancement but increased T2
signal around the right parietal resection cavity.

## 2022-01-09 MED ORDER — GADOBUTROL 1 MMOL/ML IV SOLN
6.0000 mL | Freq: Once | INTRAVENOUS | Status: AC | PRN
  Administered 2022-01-09: 6 mL via INTRAVENOUS

## 2022-01-10 ENCOUNTER — Inpatient Hospital Stay: Payer: Medicare Other | Attending: Internal Medicine | Admitting: Internal Medicine

## 2022-01-10 ENCOUNTER — Inpatient Hospital Stay: Payer: Medicare Other

## 2022-01-10 ENCOUNTER — Other Ambulatory Visit: Payer: Medicare Other

## 2022-01-10 ENCOUNTER — Encounter: Payer: Self-pay | Admitting: Internal Medicine

## 2022-01-10 ENCOUNTER — Other Ambulatory Visit (HOSPITAL_COMMUNITY): Payer: Self-pay

## 2022-01-10 ENCOUNTER — Ambulatory Visit
Admission: RE | Admit: 2022-01-10 | Discharge: 2022-01-10 | Disposition: A | Discharge status: Home / Self Care | Payer: Medicare Other | Source: Ambulatory Visit | Attending: Radiation Oncology | Admitting: Radiation Oncology

## 2022-01-10 DIAGNOSIS — C719 Malignant neoplasm of brain, unspecified: Secondary | ICD-10-CM

## 2022-01-10 DIAGNOSIS — Z809 Family history of malignant neoplasm, unspecified: Secondary | ICD-10-CM | POA: Insufficient documentation

## 2022-01-10 DIAGNOSIS — R569 Unspecified convulsions: Secondary | ICD-10-CM | POA: Diagnosis not present

## 2022-01-10 DIAGNOSIS — R112 Nausea with vomiting, unspecified: Secondary | ICD-10-CM | POA: Insufficient documentation

## 2022-01-10 DIAGNOSIS — R5383 Other fatigue: Secondary | ICD-10-CM | POA: Diagnosis not present

## 2022-01-10 DIAGNOSIS — C713 Malignant neoplasm of parietal lobe: Secondary | ICD-10-CM | POA: Insufficient documentation

## 2022-01-10 LAB — COMPREHENSIVE METABOLIC PANEL
ALT: 13 U/L (ref 0–44)
AST: 16 U/L (ref 15–41)
Albumin: 3.3 g/dL — ABNORMAL LOW (ref 3.5–5.0)
Alkaline Phosphatase: 62 U/L (ref 38–126)
Anion gap: 8 (ref 5–15)
BUN: 11 mg/dL (ref 8–23)
CO2: 24 mmol/L (ref 22–32)
Calcium: 8.8 mg/dL — ABNORMAL LOW (ref 8.9–10.3)
Chloride: 107 mmol/L (ref 98–111)
Creatinine, Ser: 0.59 mg/dL (ref 0.44–1.00)
GFR, Estimated: >60 mL/min (ref >60)
Glucose, Bld: 87 mg/dL (ref 70–99)
Potassium: 4.3 mmol/L (ref 3.5–5.1)
Sodium: 139 mmol/L (ref 135–145)
Total Bilirubin: 0.3 mg/dL (ref 0.3–1.2)
Total Protein: 6.5 g/dL (ref 6.5–8.1)

## 2022-01-10 LAB — CBC WITH DIFFERENTIAL/PLATELET
Abs Immature Granulocytes: 0.03 10*3/uL (ref 0.00–0.07)
Basophils Absolute: 0 10*3/uL (ref 0.0–0.1)
Basophils Relative: 1 %
Eosinophils Absolute: 0 10*3/uL (ref 0.0–0.5)
Eosinophils Relative: 0 %
HCT: 29.4 % — ABNORMAL LOW (ref 36.0–46.0)
Hemoglobin: 8.7 g/dL — ABNORMAL LOW (ref 12.0–15.0)
Immature Granulocytes: 1 %
Lymphocytes Relative: 38 %
Lymphs Abs: 1.3 10*3/uL (ref 0.7–4.0)
MCH: 24 pg — ABNORMAL LOW (ref 26.0–34.0)
MCHC: 29.6 g/dL — ABNORMAL LOW (ref 30.0–36.0)
MCV: 81 fL (ref 80.0–100.0)
Monocytes Absolute: 0.5 10*3/uL (ref 0.1–1.0)
Monocytes Relative: 13 %
Neutro Abs: 1.7 10*3/uL (ref 1.7–7.7)
Neutrophils Relative %: 47 %
Platelets: 222 10*3/uL (ref 150–400)
RBC: 3.63 MIL/uL — ABNORMAL LOW (ref 3.87–5.11)
RDW: 21.4 % — ABNORMAL HIGH (ref 11.5–15.5)
WBC: 3.6 10*3/uL — ABNORMAL LOW (ref 4.0–10.5)
nRBC: 0 % (ref 0.0–0.2)

## 2022-01-10 MED ORDER — LEVETIRACETAM 500 MG PO TABS: 500.0000 mg | ORAL_TABLET | Freq: Two times a day (BID) | ORAL | 2 refills | Status: AC

## 2022-01-10 MED ORDER — TEMOZOLOMIDE 140 MG PO CAPS
140.0000 mg | ORAL_CAPSULE | Freq: Every day | ORAL | 0 refills | Status: DC
  Filled 2022-01-10 – 2022-01-14 (×2): qty 5, 5d supply, fill #0

## 2022-01-10 MED ORDER — ONDANSETRON HCL 8 MG PO TABS
8.0000 mg | ORAL_TABLET | Freq: Two times a day (BID) | ORAL | 1 refills | Status: AC | PRN
  Filled 2022-01-10 – 2022-01-14 (×2): qty 30, 15d supply, fill #0

## 2022-01-10 MED ORDER — TEMOZOLOMIDE 100 MG PO CAPS
100.0000 mg | ORAL_CAPSULE | Freq: Every day | ORAL | 0 refills | Status: DC
  Filled 2022-01-10 – 2022-01-14 (×2): qty 5, 5d supply, fill #0

## 2022-01-10 NOTE — Progress Notes (Signed)
DISCONTINUE ON PATHWAY REGIMEN - Neuro ? ? ?  One cycle, concurrent with RT: ?    Temozolomide  ? ?**Always confirm dose/schedule in your pharmacy ordering system** ? ?REASON: Continuation Of Treatment ?PRIOR TREATMENT: [id number]: Radiation Therapy with Concurrent Temozolomide 75 mg/m2 Daily x 6 Weeks, Followed by Adjuvant Temozolomide ?TREATMENT RESPONSE: Stable Disease (SD) ? ?START ON PATHWAY REGIMEN - Neuro ? ? ?  A cycle is every 28 days: ?    Temozolomide  ?    Temozolomide  ? ?**Always confirm dose/schedule in your pharmacy ordering system** ? ?Patient Characteristics: ?Glioma, Glioblastoma, IDH-wildtype, Newly Diagnosed / Treatment Naive, Good Performance Status and/or Younger Patient, MGMT Promoter Unmethylated/Unknown ?Disease Classification: Glioma ?Disease Classification: Glioblastoma, IDH-wildtype ?Disease Status: Newly Diagnosed / Treatment Naive ?Performance Status: Good Performance Status and/or Younger Patient ?MGMT Promoter Methylation Status: Awaiting Test Results ?Intent of Therapy: ?Non-Curative / Palliative Intent, Discussed with Patient ?

## 2022-01-10 NOTE — Progress Notes (Signed)
Pt, husband and daughter in for follow up.  Pt reports numbness in finger tips bilaterally.

## 2022-01-10 NOTE — Progress Notes (Signed)
[facility] [place], [place] [address] [phone]   Interval Evaluation  Date of Service: 01/10/22 Patient Name: Nina Johnson Patient MRN: [id number] Patient DOB: 05-May-1951 Provider: Ventura Sellers, MD  Identifying Statement:  Nina Johnson is a 71 y.o. female with right parietal glioblastoma   Oncologic History: Oncology History  Glioblastoma ([place])  10/04/2021 Surgery   Re-resection at [place] with Dr. Tommi Rumps; path c/w glioblastoma IDHwt   11/06/2021 - 12/17/2021 Radiation Therapy   IMRT and concurrent Temodar with Dr. Baruch Gouty.     Biomarkers:  MGMT Unknown.  IDH 1/2 Wild type.  EGFR Unknown  TERT Unknown   Interval History: Nina Johnson presents today for follow up, now having completed radiation with Temodadr.  She feels well overall, no clinical changes.  Remains with very mild left sided issues.  No further seizures.  Fatigue is prominent as prior.  No further decadron.  H+P (10/25/21) Patient presented to medical attention in [facility] in early January this year after first ever seizure.  CNS imaging demonstrated large R parietal mass; she underwent initial resection in [place], where path demonstrated glioblastoma.  She then went to [facility] for additional evaluation, where she subsequently underwent repeat craniotomy with Dr. Tommi Rumps given large amount of residual disease.  Since surgery, she has no specific complaints aside from fatigue.  She remains active, functionally independent, without recurrence of seizures. She would like to obtain radiation and chemotherapy treatments close to home, thus the referral here today.  Medications: Current Outpatient Medications on File Prior to Visit  Medication Sig Dispense Refill   Iron Polysacch Cmplx-B12-FA (POLY-IRON 150 FORTE) 150-0.025-1 MG CAPS Take 1 capsule (150 mg) by mouth daily. 30 capsule 2   lansoprazole (PREVACID) 30 MG capsule Take 1  capsule (30 mg total) by mouth daily at 12 noon. 90 capsule 4   traZODone (DESYREL) 50 MG tablet Take 0.5-1 tablets (25-50 mg total) by mouth at bedtime as needed for sleep. 30 tablet 2   acetaminophen (TYLENOL) 650 MG CR tablet Take 650 mg by mouth every 8 (eight) hours as needed for pain. (Patient not taking: Reported on 12/06/2021)     dexamethasone (DECADRON) 4 MG tablet Take 1 tablet (4 mg total) by mouth 2 (two) times daily with a meal. (Patient not taking: Reported on 12/06/2021) 60 tablet 0   DOCUSATE SODIUM PO Take 1 tablet by mouth every other day. (Patient not taking: Reported on 01/10/2022)     ondansetron (ZOFRAN) 8 MG tablet Take 1 tablet (8 mg total) by mouth 2 (two) times daily as needed (nausea and vomiting). May take 30-60 minutes prior to Temodar administration if nausea/vomiting occurs. (Patient not taking: Reported on 12/06/2021) 30 tablet 1   oxyCODONE (OXY IR/ROXICODONE) 5 MG immediate release tablet Take 1 tablet (5 mg total) by mouth every 4 (four) hours as needed for moderate pain. (Patient not taking: Reported on 11/22/2021) 30 tablet 0   pantoprazole (PROTONIX) 40 MG tablet Take 40 mg by mouth daily. (Patient not taking: Reported on 01/10/2022)     temozolomide (TEMODAR) 100 MG capsule Take 1 capsule (100 mg total) by mouth daily. May take on an empty stomach to decrease nausea & vomiting. (Patient not taking: Reported on 12/06/2021) 42 capsule 0   temozolomide (TEMODAR) 20 MG capsule Take 1 capsule (20 mg total) by mouth daily. May take on an empty stomach to decrease nausea & vomiting. (Patient not taking: Reported  on 12/06/2021) 42 capsule 0   No current facility-administered medications on file prior to visit.    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 10/04/2021   Dehiscence of closure of skull or craniotomy 08/30/2021   Glioblastoma ([place])    Past Surgical History:  Social  History:  Social History   Socioeconomic History   Marital status: Widowed    Spouse name: Not on file   Number of children: Not on file   Years of education: Not on file   Highest education level: Not on file  Occupational History   Not on file  Tobacco Use   Smoking status: Never   Smokeless tobacco: Never  Vaping Use   Vaping Use: Never used  Substance and Sexual Activity   Alcohol use: Never   Drug use: Never   Sexual activity: Not Currently  Other Topics Concern   Not on file  Social History Narrative   Not on file   Social Determinants of Health   Financial Resource Strain: Not on file  Food Insecurity: Not on file  Transportation Needs: Not on file  Physical Activity: Not on file  Stress: Not on file  Social Connections: Not on file  Intimate Partner Violence: Not on file   Family History:  Family History  Problem Relation Age of Onset   Cancer Other    Seizures Child     Review of Systems: Constitutional: Doesn't report fevers, chills or abnormal weight loss Eyes: Doesn't report blurriness of vision Ears, nose, mouth, throat, and face: Doesn't report sore throat Respiratory: Doesn't report cough, dyspnea or wheezes Cardiovascular: Doesn't report palpitation, chest discomfort  Gastrointestinal:  Doesn't report nausea, constipation, diarrhea GU: Doesn't report incontinence Skin: Doesn't report skin rashes Neurological: Per HPI Musculoskeletal: Doesn't report joint pain Behavioral/Psych: Doesn't report anxiety  Physical Exam: Wt Readings from Last 3 Encounters:  12/06/21 139 lb 4.8 oz (63.2 kg)  11/22/21 132 lb 11.2 oz (60.2 kg)  11/03/21 140 lb 10.5 oz (63.8 kg)   Temp Readings from Last 3 Encounters:  11/22/21 (!) 96.7 F (35.9 C)  11/07/21 98.7 F (37.1 C) (Tympanic)  11/05/21 99.2 F (37.3 C) (Oral)   BP Readings from Last 3 Encounters:  12/06/21 118/66  11/22/21 122/74  11/07/21 111/68   Pulse Readings from Last 3 Encounters:   12/06/21 92  11/22/21 62  11/07/21 91    KPS: 90. General: Alert, cooperative, pleasant, in no acute distress Head: Normal EENT: No conjunctival injection or scleral icterus.  Lungs: Resp effort normal Cardiac: Regular rate Abdomen: Non-distended abdomen Skin: No rashes cyanosis or petechiae. Extremities: No clubbing or edema  Neurologic Exam: Mental Status: Awake, alert, attentive to examiner. Oriented to self and environment. Language is fluent with intact comprehension.  Cranial Nerves: Visual acuity is grossly normal. Visual fields are full. Extra-ocular movements intact. No ptosis. Face is symmetric Motor: Tone and bulk are normal. Power is full in both arms and legs. Reflexes are symmetric, no pathologic reflexes present.  Sensory: Intact to light touch Gait: Normal.   Labs: I have reviewed the data as listed    Component Value Date/Time   NA 139 01/10/2022 0935   K 4.3 01/10/2022 0935   CL 107 01/10/2022 0935   CO2 24 01/10/2022 0935   GLUCOSE 87 01/10/2022 0935   BUN 11 01/10/2022 0935   CREATININE 0.59 01/10/2022 0935   CALCIUM 8.8 (L)  01/10/2022 0935   PROT 6.5 01/10/2022 0935   ALBUMIN 3.3 (L) 01/10/2022 0935   AST 16 01/10/2022 0935   ALT 13 01/10/2022 0935   ALKPHOS 62 01/10/2022 0935   BILITOT 0.3 01/10/2022 0935   GFRNONAA >60 01/10/2022 0935   Lab Results  Component Value Date   WBC 3.6 (L) 01/10/2022   NEUTROABS 1.7 01/10/2022   HGB 8.7 (L) 01/10/2022   HCT 29.4 (L) 01/10/2022   MCV 81.0 01/10/2022   PLT 222 01/10/2022   Imaging:  [facility] Clinician Interpretation: I have personally reviewed the CNS images as listed.  My interpretation, in the context of the patient's clinical presentation, is stable disease  MR BRAIN W WO CONTRAST  Result Date: 01/09/2022 CLINICAL DATA:  History of glioblastoma. History of craniotomy and radiotherapy. EXAM: MRI HEAD WITHOUT AND WITH CONTRAST TECHNIQUE: Multiplanar, multiecho pulse sequences of the brain and  surrounding structures were obtained without and with intravenous contrast. CONTRAST:  88m GADAVIST GADOBUTROL 1 MMOL/ML IV SOLN COMPARISON:  11/04/2021 FINDINGS: Brain: Glioblastoma centered in the right parietal lobe with resection cavity. Anterior masslike area of dense cellular features of T2 hypointensity and enhancement with central necrotic appearance. This area of enhancement measures less than before, with transverse span measuring 19 mm as compared to 23 mm on coronal postcontrast acquisition. There is a rim of enhancement around the resection cavity which is less thick than before. By T1 weighted imaging, the fluid within the cavity is also more simple. There is however increased T2 signal especially along the anterior and superior aspect of the mass. No acute infarct, acute hemorrhage, hydrocephalus, or collection. No midline shift. Cerebellar atrophy. Vascular: Major flow voids and vascular enhancements are preserved Skull and upper cervical spine: Extensive artifact from right craniotomy with history of skull dehiscence Sinuses/Orbits: Negative IMPRESSION: Mixed findings of mildly decreased enhancement but increased T2 signal around the right parietal resection cavity. Electronically Signed   By: JJorje GuildM.D.   On: 01/09/2022 13:35    Assessment/Plan [place]lioblastoma ([place]  [place] ([place]  PSan Ruais clinically stable today, now having completed full 6 weeks of IMRT and Temodar.  Brain MRI demonstrates stable findings.  We recommended initiating treatment with Temozolomide 1557mm2, on for five days and off for twenty three days in twenty eight day cycles. The patient will have a complete blood count performed on days 21 and 28 of each cycle, and a comprehensive metabolic panel performed on day 28 of each cycle. Labs may need to be performed more often. Zofran will prescribed for home use for nausea/vomiting.   Informed consent was obtained verbally at bedside to proceed with oral  chemotherapy.  Chemotherapy should be held for the following:  ANC less than 1,000  Platelets less than 100,000  LFT or creatinine greater than 2x ULN  If clinical concerns/contraindications develop  Keppra will decrease to 50073mID due to seizure freedom, fatigue and mood complaints.  She will meet with [facility] team next week prior to beginning adjuvant therapy.  We appreciate the opportunity to participate in the care of PatDeeandra Jerry We ask that PatHarika Laidlawturn to clinic in 1 months prior to cycle #2 with labs for evaluation.  All questions were answered. The patient knows to call the clinic with any problems, questions or concerns. No barriers to learning were detected.  The total time spent in the encounter was 40 minutes and more than 50% was on counseling and review of test results   ZacAlroy Dust  Harold Hedge, MD Medical Director of Neuro-Oncology [facility] at [place] 01/10/22 10:58 AM

## 2022-01-13 ENCOUNTER — Inpatient Hospital Stay: Payer: Medicare Other

## 2022-01-13 ENCOUNTER — Other Ambulatory Visit (HOSPITAL_COMMUNITY): Payer: Self-pay

## 2022-01-14 ENCOUNTER — Telehealth: Payer: Self-pay | Admitting: Pharmacist

## 2022-01-14 ENCOUNTER — Encounter: Payer: Self-pay | Admitting: Internal Medicine

## 2022-01-14 ENCOUNTER — Other Ambulatory Visit (HOSPITAL_COMMUNITY): Payer: Self-pay

## 2022-01-14 NOTE — Telephone Encounter (Signed)
Oral Chemotherapy Pharmacist Encounter  Was able to reach patient for re-education on 01/14/22. [facility] will deliver medication to patient on 01/15/22. She will get started pending her Thursday appt with [facility].  Patient Education I spoke with patient for re-education for chemotherapy medication: temozolomide   Counseled patient on the new administration/dosing. Patient will take one '100mg'$  capsule and one '140mg'$  capsule by mouth daily. May take on an empty stomach to decrease nausea & vomiting.  **She will take one ondansetron 30-60 mins prior to the temozolomide to help prevent nausea  Reviewed with patient importance of keeping a medication schedule and plan for any missed doses.  After discussion with patient no patient barriers to medication adherence identified.   Ms. Dowse voiced understanding and appreciation. All questions answered. Medication handout provided.  Provided patient with Oral [facility] phone number. Patient knows to call the office with questions or concerns. Oral Chemotherapy Navigation Clinic will continue to follow.  Darl Pikes, PharmD, BCPS, BCOP, CPP Hematology/Oncology Clinical Pharmacist Practitioner [place]/DB/AP Oral [facility] [phone]  01/14/2022 1:24 PM

## 2022-01-17 ENCOUNTER — Other Ambulatory Visit (HOSPITAL_COMMUNITY): Payer: Self-pay

## 2022-01-21 ENCOUNTER — Telehealth: Payer: Self-pay | Admitting: *Deleted

## 2022-01-21 NOTE — Telephone Encounter (Signed)
Call from [place] with Dr Low office reporting that patient has completed radiation therapy and would benefit from Temodar 150 mg/ m2 for 5 day cycle and if tolerates, can increase to 200 mg/ m2 for subsequent cycle. Repeat imaging in 8 weeks and return to see Dr Konrad Felix

## 2022-01-29 ENCOUNTER — Other Ambulatory Visit: Payer: Self-pay | Admitting: *Deleted

## 2022-01-29 ENCOUNTER — Other Ambulatory Visit: Payer: Self-pay | Admitting: Nurse Practitioner

## 2022-01-29 ENCOUNTER — Encounter: Payer: Self-pay | Admitting: Internal Medicine

## 2022-01-29 ENCOUNTER — Other Ambulatory Visit (HOSPITAL_COMMUNITY): Payer: Self-pay

## 2022-01-29 DIAGNOSIS — C719 Malignant neoplasm of brain, unspecified: Secondary | ICD-10-CM

## 2022-01-29 MED ORDER — FERREX 150 FORTE 150-1-25 MG-MG-MCG PO CAPS
150.0000 mg | ORAL_CAPSULE | Freq: Every day | ORAL | 2 refills | Status: DC
  Filled 2022-01-29: qty 90, 90d supply, fill #0
  Filled 2022-01-29: qty 30, 30d supply, fill #0

## 2022-01-30 ENCOUNTER — Other Ambulatory Visit: Payer: Self-pay | Admitting: Internal Medicine

## 2022-01-30 ENCOUNTER — Other Ambulatory Visit (HOSPITAL_COMMUNITY): Payer: Self-pay

## 2022-01-30 DIAGNOSIS — C719 Malignant neoplasm of brain, unspecified: Secondary | ICD-10-CM

## 2022-01-30 MED ORDER — FERREX 150 FORTE 150-1-25 MG-MG-MCG PO CAPS: 150.0000 mg | ORAL_CAPSULE | Freq: Every day | ORAL | 2 refills | Status: DC

## 2022-02-03 ENCOUNTER — Other Ambulatory Visit (HOSPITAL_COMMUNITY): Payer: Self-pay

## 2022-02-05 ENCOUNTER — Other Ambulatory Visit (HOSPITAL_COMMUNITY): Payer: Self-pay

## 2022-02-06 ENCOUNTER — Encounter: Payer: Self-pay | Admitting: Internal Medicine

## 2022-02-06 ENCOUNTER — Other Ambulatory Visit (HOSPITAL_COMMUNITY): Payer: Self-pay

## 2022-02-07 ENCOUNTER — Ambulatory Visit: Admission: RE | Admit: 2022-02-07 | Payer: Medicare Other | Source: Ambulatory Visit

## 2022-02-14 ENCOUNTER — Other Ambulatory Visit (HOSPITAL_COMMUNITY): Payer: Self-pay

## 2022-02-14 ENCOUNTER — Encounter: Payer: Self-pay | Admitting: Internal Medicine

## 2022-02-14 ENCOUNTER — Inpatient Hospital Stay: Payer: Medicare Other | Attending: Radiation Oncology | Admitting: Internal Medicine

## 2022-02-14 VITALS — Temp 97.4°F | Resp 18 | Wt 140.3 lb

## 2022-02-14 DIAGNOSIS — Z809 Family history of malignant neoplasm, unspecified: Secondary | ICD-10-CM | POA: Diagnosis not present

## 2022-02-14 DIAGNOSIS — Z79899 Other long term (current) drug therapy: Secondary | ICD-10-CM | POA: Diagnosis not present

## 2022-02-14 DIAGNOSIS — R569 Unspecified convulsions: Secondary | ICD-10-CM

## 2022-02-14 DIAGNOSIS — R112 Nausea with vomiting, unspecified: Secondary | ICD-10-CM | POA: Diagnosis not present

## 2022-02-14 DIAGNOSIS — C719 Malignant neoplasm of brain, unspecified: Secondary | ICD-10-CM

## 2022-02-14 DIAGNOSIS — C713 Malignant neoplasm of parietal lobe: Secondary | ICD-10-CM | POA: Insufficient documentation

## 2022-02-14 MED ORDER — TEMOZOLOMIDE 140 MG PO CAPS
140.0000 mg | ORAL_CAPSULE | Freq: Every day | ORAL | 0 refills | Status: AC
  Filled 2022-02-14: qty 14, 14d supply, fill #0
  Filled 2022-02-17: qty 5, 28d supply, fill #0

## 2022-02-14 MED ORDER — BACLOFEN 10 MG PO TABS: 5.0000 mg | ORAL_TABLET | Freq: Three times a day (TID) | ORAL | 0 refills | Status: AC | PRN

## 2022-02-14 MED ORDER — TEMOZOLOMIDE 100 MG PO CAPS
100.0000 mg | ORAL_CAPSULE | Freq: Every day | ORAL | 0 refills | Status: AC
  Filled 2022-02-14: qty 5, 5d supply, fill #0
  Filled 2022-02-17: qty 5, 28d supply, fill #0

## 2022-02-17 ENCOUNTER — Other Ambulatory Visit (HOSPITAL_COMMUNITY): Payer: Self-pay

## 2022-02-18 ENCOUNTER — Other Ambulatory Visit: Payer: Self-pay | Admitting: *Deleted

## 2022-02-18 ENCOUNTER — Other Ambulatory Visit (HOSPITAL_COMMUNITY): Payer: Self-pay

## 2022-02-18 DIAGNOSIS — C719 Malignant neoplasm of brain, unspecified: Secondary | ICD-10-CM

## 2022-02-18 NOTE — Progress Notes (Signed)
Faxed referral to [facility] in [place].  [phone].  Instructed patient to also request her records from [facility] to be faxed to them as well.  She confirmed understanding.

## 2022-02-19 ENCOUNTER — Telehealth: Payer: Self-pay

## 2022-02-19 NOTE — Telephone Encounter (Signed)
Call received from [facility] at the [facility] stating she needs all path reports and imaging reports faxed before they can make a referral appointment.  Records faxed to [phone] and confirmation received.  Pt has been advised of the additional records request and after review should receive a call with an appt.

## 2022-02-25 ENCOUNTER — Other Ambulatory Visit: Payer: Self-pay | Admitting: Internal Medicine

## 2022-02-25 DIAGNOSIS — C719 Malignant neoplasm of brain, unspecified: Secondary | ICD-10-CM

## 2022-03-17 ENCOUNTER — Other Ambulatory Visit (HOSPITAL_COMMUNITY): Payer: Self-pay

## 2022-03-21 ENCOUNTER — Other Ambulatory Visit (HOSPITAL_COMMUNITY): Payer: Self-pay

## 2022-04-17 ENCOUNTER — Other Ambulatory Visit (HOSPITAL_COMMUNITY): Payer: Self-pay

## 2022-04-22 ENCOUNTER — Other Ambulatory Visit: Payer: Self-pay

## 2022-04-24 ENCOUNTER — Other Ambulatory Visit: Payer: Self-pay

## 2022-04-27 ENCOUNTER — Other Ambulatory Visit: Payer: Self-pay

## 2022-04-30 ENCOUNTER — Other Ambulatory Visit: Payer: Self-pay

## 2022-05-03 ENCOUNTER — Other Ambulatory Visit: Payer: Self-pay

## 2022-06-17 ENCOUNTER — Other Ambulatory Visit: Payer: Self-pay

## 2022-08-17 ENCOUNTER — Other Ambulatory Visit: Payer: Self-pay

## 2022-08-25 ENCOUNTER — Other Ambulatory Visit: Payer: Self-pay

## 2022-08-26 ENCOUNTER — Telehealth: Payer: Self-pay | Admitting: Oncology

## 2022-08-26 NOTE — Telephone Encounter (Signed)
Patient's cousin Tye [place] called) and stated that patient is still undecided on moving here from [place] and for now will remain there for treatment (next infusion date 1/8).  Patient's family would like her to move here in order to be closer to family but again, patient is not agreeable at this time and will not be here for scheduled appointments with Dr. Grayland Ormond and Dr. Mickeal Skinner this week so they have been cancelled.

## 2022-08-29 ENCOUNTER — Inpatient Hospital Stay: Payer: Medicare Other | Admitting: Oncology

## 2022-08-29 ENCOUNTER — Inpatient Hospital Stay: Payer: Medicare Other | Admitting: Internal Medicine

## 2022-10-08 ENCOUNTER — Other Ambulatory Visit: Payer: Self-pay

## 2022-10-10 ENCOUNTER — Other Ambulatory Visit: Payer: Self-pay
